# Patient Record
Sex: Male | Born: 1970 | Hispanic: No | Marital: Married | Smoking: Never smoker
Health system: Southern US, Community
[De-identification: ages and names within clinical notes are randomized; demographics above are authoritative.]

## PROBLEM LIST (undated history)

## (undated) DIAGNOSIS — K200 Eosinophilic esophagitis: Secondary | ICD-10-CM

## (undated) DIAGNOSIS — R6882 Decreased libido: Secondary | ICD-10-CM

## (undated) DIAGNOSIS — R351 Nocturia: Secondary | ICD-10-CM

## (undated) DIAGNOSIS — B977 Papillomavirus as the cause of diseases classified elsewhere: Secondary | ICD-10-CM

## (undated) DIAGNOSIS — G4700 Insomnia, unspecified: Secondary | ICD-10-CM

## (undated) DIAGNOSIS — E7800 Pure hypercholesterolemia, unspecified: Secondary | ICD-10-CM

## (undated) DIAGNOSIS — E039 Hypothyroidism, unspecified: Secondary | ICD-10-CM

## (undated) DIAGNOSIS — N529 Male erectile dysfunction, unspecified: Secondary | ICD-10-CM

## (undated) DIAGNOSIS — R7401 Elevation of levels of liver transaminase levels: Secondary | ICD-10-CM

## (undated) DIAGNOSIS — R740 Nonspecific elevation of levels of transaminase and lactic acid dehydrogenase [LDH]: Secondary | ICD-10-CM

## (undated) DIAGNOSIS — F909 Attention-deficit hyperactivity disorder, unspecified type: Secondary | ICD-10-CM

## (undated) DIAGNOSIS — M5136 Other intervertebral disc degeneration, lumbar region: Secondary | ICD-10-CM

## (undated) DIAGNOSIS — K219 Gastro-esophageal reflux disease without esophagitis: Secondary | ICD-10-CM

## (undated) DIAGNOSIS — E559 Vitamin D deficiency, unspecified: Secondary | ICD-10-CM

## (undated) DIAGNOSIS — IMO0002 Reserved for concepts with insufficient information to code with codable children: Secondary | ICD-10-CM

## (undated) HISTORY — DX: Reserved for concepts with insufficient information to code with codable children: IMO0002

## (undated) HISTORY — DX: Papillomavirus as the cause of diseases classified elsewhere: B97.7

## (undated) HISTORY — DX: Hypothyroidism, unspecified: E03.9

## (undated) HISTORY — DX: Nocturia: R35.1

## (undated) HISTORY — DX: Elevation of levels of liver transaminase levels: R74.01

## (undated) HISTORY — DX: Other intervertebral disc degeneration, lumbar region: M51.36

## (undated) HISTORY — DX: Eosinophilic esophagitis: K20.0

## (undated) HISTORY — DX: Insomnia, unspecified: G47.00

## (undated) HISTORY — DX: Nonspecific elevation of levels of transaminase and lactic acid dehydrogenase (ldh): R74.0

## (undated) HISTORY — DX: Decreased libido: R68.82

## (undated) HISTORY — DX: Gastro-esophageal reflux disease without esophagitis: K21.9

## (undated) HISTORY — PX: TONSILLECTOMY: SUR1361

## (undated) HISTORY — DX: Pure hypercholesterolemia, unspecified: E78.00

## (undated) HISTORY — DX: Attention-deficit hyperactivity disorder, unspecified type: F90.9

## (undated) HISTORY — DX: Vitamin D deficiency, unspecified: E55.9

## (undated) HISTORY — DX: Male erectile dysfunction, unspecified: N52.9

---
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
## 2007-09-09 ENCOUNTER — Ambulatory Visit (HOSPITAL_BASED_OUTPATIENT_CLINIC_OR_DEPARTMENT_OTHER): Admission: RE | Admit: 2007-09-09 | Discharge: 2007-09-09 | Payer: Self-pay | Admitting: Urology

## 2011-03-13 NOTE — Op Note (Signed)
NAMEJERMAIN, Scott Schmidt           ACCOUNT NO.:  [id number]   MEDICAL RECORD NO.:  [id number]          PATIENT TYPE:  AMB   LOCATION:  [facility]                         FACILITY:  [facility]   PHYSICIAN:  Jamison Neighbor, M.D.  DATE OF BIRTH:  1971-05-27   DATE OF PROCEDURE:  09/09/2007  DATE OF DISCHARGE:                               OPERATIVE REPORT   PREOPERATIVE DIAGNOSIS:  Meatal condyloma.   SECONDARY DIAGNOSIS:  Hematuria.   POSTOPERATIVE DIAGNOSIS:  Meatal condyloma and hematuria.   PROCEDURES:  1. Laser fulguration of meatal condyloma.  2. Examination under anesthesia for additional genital condyloma.  3. Cystoscopy.   SURGEON:  Jamison Neighbor, M.D.   ANESTHESIA:  General.   COMPLICATIONS:  None.   DRAINS:  A 14-French catheter.   HISTORY:  A 40 year old male who developed hematuria and was found to  have a cluster of condyloma at the meatus.  Careful inspection of the  penis in the office did not show any other condylomata.  The patient  does have a wife who is known to have HPV present.  The patient is now  to undergo laser fulguration along with cystoscopic examination.  He  understands the risks and benefits of the procedure and gave full and  informed consent.   PROCEDURE:  After successful induction of general anesthesia, the  patient was prepped and draped in usual sterile fashion in the dorsal  lithotomy position.  The meatus was carefully inspected and a nasal  speculum was  also used to aid in this area.  The patient had  condylomata exiting out of the urethra.  These were carefully fulgurated  with the laser on the low as possible settings, until all the tissue had  been fully vaporized.  The nasal speculum was used to confirm that there  was no additional tissue at the meatus.  The patient underwent acetic  acid testing.  The entire genital area was soaked in acetic acid and  careful inspection showed no acetowhite lesions, specifically nothing  along the  penile shaft or glans penis; nothing on the scrotum, nothing  in the perineal area and nothing in the rectal area.  Following  completion of the acetic acid test, a cystoscopic examination was  performed.  The cystoscope was inserted.  The urethra was visualized in  its entirety; it was found to be normal.  Beyond the verumontanum the  prostate was unremarkable with no obstruction.  The bladder was  carefully inspected, no tumors or stones could be seen.  Both ureteral  orifices were normal configuration and location.  Final inspection as  the scope was withdrawn also showed no sign of penile condylomata, and  it appeared that the area at the tip of the urethra had been fully  fulgurated.  A final inspection of the nasal speculum was again  confirmed that not only had the lesion been removed, but that adequate  hemostasis was obtained.   Because of its location, it was felt that a Foley catheter for a day or  two would be prudent; the catheter was inserted.  This will be placed to  straight drainage.  The patient will be given prescriptions for Septra  as well as Lorcet Plus.  He will return to remove catheter in 1-2 days.      Jamison Neighbor, M.D.  Electronically Signed     RJE/MEDQ  D:  09/09/2007  T:  09/09/2007  Job:  [id number]

## 2011-08-07 LAB — POCT HEMOGLOBIN-HEMACUE
Hemoglobin: 16.1
Operator id: 268271

## 2014-08-05 ENCOUNTER — Other Ambulatory Visit: Payer: Self-pay | Admitting: Internal Medicine

## 2014-08-05 DIAGNOSIS — R945 Abnormal results of liver function studies: Secondary | ICD-10-CM

## 2014-08-13 ENCOUNTER — Ambulatory Visit
Admission: RE | Admit: 2014-08-13 | Discharge: 2014-08-13 | Disposition: A | Discharge status: Home / Self Care | Payer: BC Managed Care – HMO | Source: Ambulatory Visit | Attending: Internal Medicine | Admitting: Internal Medicine

## 2014-08-13 DIAGNOSIS — R945 Abnormal results of liver function studies: Secondary | ICD-10-CM

## 2014-10-29 DIAGNOSIS — IMO0002 Reserved for concepts with insufficient information to code with codable children: Secondary | ICD-10-CM

## 2014-10-29 HISTORY — DX: Reserved for concepts with insufficient information to code with codable children: IMO0002

## 2014-10-29 HISTORY — PX: OTHER SURGICAL HISTORY: SHX169

## 2015-06-28 ENCOUNTER — Encounter (HOSPITAL_COMMUNITY): Payer: Self-pay | Admitting: Emergency Medicine

## 2015-06-28 ENCOUNTER — Emergency Department (HOSPITAL_COMMUNITY)
Admission: EM | Admit: 2015-06-28 | Discharge: 2015-06-28 | Disposition: A | Discharge status: Home / Self Care | Payer: BLUE CROSS/BLUE SHIELD | Attending: Emergency Medicine | Admitting: Emergency Medicine

## 2015-06-28 DIAGNOSIS — M5442 Lumbago with sciatica, left side: Secondary | ICD-10-CM

## 2015-06-28 DIAGNOSIS — M545 Low back pain: Secondary | ICD-10-CM | POA: Diagnosis present

## 2015-06-28 DIAGNOSIS — M62830 Muscle spasm of back: Secondary | ICD-10-CM | POA: Diagnosis not present

## 2015-06-28 DIAGNOSIS — Z880 Allergy status to penicillin: Secondary | ICD-10-CM | POA: Diagnosis not present

## 2015-06-28 MED ORDER — METHOCARBAMOL 500 MG PO TABS: 1000.0000 mg | ORAL_TABLET | Freq: Two times a day (BID) | ORAL | Status: DC

## 2015-06-28 MED ORDER — HYDROCODONE-ACETAMINOPHEN 5-325 MG PO TABS
1.0000 | ORAL_TABLET | Freq: Once | ORAL | Status: AC
  Administered 2015-06-28: 1 via ORAL
  Filled 2015-06-28: qty 1

## 2015-06-28 MED ORDER — PREDNISONE 20 MG PO TABS
60.0000 mg | ORAL_TABLET | Freq: Once | ORAL | Status: AC
  Administered 2015-06-28: 60 mg via ORAL
  Filled 2015-06-28: qty 3

## 2015-06-28 MED ORDER — METHOCARBAMOL 500 MG PO TABS
1000.0000 mg | ORAL_TABLET | Freq: Once | ORAL | Status: AC
  Administered 2015-06-28: 1000 mg via ORAL
  Filled 2015-06-28: qty 2

## 2015-06-28 MED ORDER — HYDROCODONE-ACETAMINOPHEN 5-325 MG PO TABS: 1.0000 | ORAL_TABLET | ORAL | Status: DC | PRN

## 2015-06-28 MED ORDER — PREDNISONE 10 MG PO TABS: ORAL_TABLET | ORAL | Status: DC

## 2015-06-28 NOTE — Discharge Instructions (Signed)
Back Exercises °Back exercises help treat and prevent back injuries. The goal of back exercises is to increase the strength of your abdominal and back muscles and the flexibility of your back. These exercises should be started when you no longer have back pain. Back exercises include: °· Pelvic Tilt. Lie on your back with your knees bent. Tilt your pelvis until the lower part of your back is against the floor. Hold this position 5 to 10 sec and repeat 5 to 10 times. °· Knee to Chest. Pull first 1 knee up against your chest and hold for 20 to 30 seconds, repeat this with the other knee, and then both knees. This may be done with the other leg straight or bent, whichever feels better. °· Sit-Ups or Curl-Ups. Bend your knees 90 degrees. Start with tilting your pelvis, and do a partial, slow sit-up, lifting your trunk only 30 to 45 degrees off the floor. Take at least 2 to 3 seconds for each sit-up. Do not do sit-ups with your knees out straight. If partial sit-ups are difficult, simply do the above but with only tightening your abdominal muscles and holding it as directed. °· Hip-Lift. Lie on your back with your knees flexed 90 degrees. Push down with your feet and shoulders as you raise your hips a couple inches off the floor; hold for 10 seconds, repeat 5 to 10 times. °· Back arches. Lie on your stomach, propping yourself up on bent elbows. Slowly press on your hands, causing an arch in your low back. Repeat 3 to 5 times. Any initial stiffness and discomfort should lessen with repetition over time. °· Shoulder-Lifts. Lie face down with arms beside your body. Keep hips and torso pressed to floor as you slowly lift your head and shoulders off the floor. °Do not overdo your exercises, especially in the beginning. Exercises may cause you some mild back discomfort which lasts for a few minutes; however, if the pain is more severe, or lasts for more than 15 minutes, do not continue exercises until you see your caregiver.  Improvement with exercise therapy for back problems is slow.  °See your caregivers for assistance with developing a proper back exercise program. °Document Released: 11/22/2004 Document Revised: 01/07/2012 Document Reviewed: 08/16/2011 °ExitCare® Patient Information ©2015 [employer]. This information is not intended to replace advice given to you by your health care provider. Make sure you discuss any questions you have with your health care provider. °Muscle Cramps and Spasms °Muscle cramps and spasms occur when a muscle or muscles tighten and you have no control over this tightening (involuntary muscle contraction). They are a common problem and can develop in any muscle. The most common place is in the calf muscles of the leg. Both muscle cramps and muscle spasms are involuntary muscle contractions, but they also have differences:  °· Muscle cramps are sporadic and painful. They may last a few seconds to a quarter of an hour. Muscle cramps are often more forceful and last longer than muscle spasms. °· Muscle spasms may or may not be painful. They may also last just a few seconds or much longer. °CAUSES  °It is uncommon for cramps or spasms to be due to a serious underlying problem. In many cases, the cause of cramps or spasms is unknown. Some common causes are:  °· Overexertion.   °· Overuse from repetitive motions (doing the same thing over and over).   °· Remaining in a certain position for a long period of time.   °· Improper   preparation, form, or technique while performing a sport or activity.   °· Dehydration.   °· Injury.   °· Side effects of some medicines.   °· Abnormally low levels of the salts and ions in your blood (electrolytes), especially potassium and calcium. This could happen if you are taking water pills (diuretics) or you are pregnant.   °Some underlying medical problems can make it more likely to develop cramps or spasms. These include, but are not limited to:  °· Diabetes.   °· Parkinson  disease.   °· Hormone disorders, such as thyroid problems.   °· Alcohol abuse.   °· Diseases specific to muscles, joints, and bones.   °· Blood vessel disease where not enough blood is getting to the muscles.   °HOME CARE INSTRUCTIONS  °· Stay well hydrated. Drink enough water and fluids to keep your urine clear or pale yellow. °· It may be helpful to massage, stretch, and relax the affected muscle. °· For tight or tense muscles, use a warm towel, heating pad, or hot shower water directed to the affected area. °· If you are sore or have pain after a cramp or spasm, applying ice to the affected area may relieve discomfort. °¨ Put ice in a plastic bag. °¨ Place a towel between your skin and the bag. °¨ Leave the ice on for 15-20 minutes, 03-04 times a day. °· Medicines used to treat a known cause of cramps or spasms may help reduce their frequency or severity. Only take over-the-counter or prescription medicines as directed by your caregiver. °SEEK MEDICAL CARE IF:  °Your cramps or spasms get more severe, more frequent, or do not improve over time.  °MAKE SURE YOU:  °· Understand these instructions. °· Will watch your condition. °· Will get help right away if you are not doing well or get worse. °Document Released: 04/06/2002 Document Revised: 02/09/2013 Document Reviewed: 10/01/2012 °ExitCare® Patient Information ©2015 [employer]. This information is not intended to replace advice given to you by your health care provider. Make sure you discuss any questions you have with your health care provider. ° °

## 2015-06-28 NOTE — ED Provider Notes (Signed)
CSN: [id number]     Arrival date & time 06/28/15  2242 History   First MD Initiated Contact with Patient 06/28/15 2251     Chief Complaint  Patient presents with  . Back Pain     (Consider location/radiation/quality/duration/timing/severity/associated sxs/prior Treatment) Patient is a 44 y.o. male presenting with back pain. The history is provided by the patient. No language interpreter was used.  Back Pain Location:  Lumbar spine Quality:  Stabbing and shooting Radiates to:  L thigh Pain severity:  Severe Onset quality:  Gradual Duration:  2 days Timing:  Constant Associated symptoms: no abdominal pain, no fever, no numbness and no weakness   Associated symptoms comment:  Patient with a history of "mild slipped discs L4-5, L5-S1 per previous MRI 2 years ago. He reports he is usually pain free, able to work out regularly. He has been moving over the last 2 days and has felt some mild back soreness. Today the soreness became sharp shooting pain that radiated into his left leg. No falls, weakness, numbness. No incontinence or saddle anesthesia.   No past medical history on file. Past Surgical History  Procedure Laterality Date  . Tonsillectomy     No family history on file. Social History  Substance Use Topics  . Smoking status: Not on file  . Smokeless tobacco: Not on file  . Alcohol Use: Not on file    Review of Systems  Constitutional: Negative for fever and chills.  Gastrointestinal: Negative.  Negative for nausea and abdominal pain.  Genitourinary: Negative.  Negative for enuresis.  Musculoskeletal: Positive for back pain. Negative for neck pain.  Skin: Negative.  Negative for color change.  Neurological: Negative.  Negative for weakness and numbness.      Allergies  Penicillins  Home Medications   Prior to Admission medications   Not on File   BP 125/54 mmHg  Pulse 67  Temp(Src) 98.3 F (36.8 C) (Oral)  Resp 16  SpO2 94% Physical Exam  Constitutional:  He is oriented to person, place, and time. He appears well-developed and well-nourished.  Neck: Normal range of motion.  Pulmonary/Chest: Effort normal.  Abdominal: Soft. He exhibits no mass. There is no tenderness.  Musculoskeletal: Normal range of motion. He exhibits no edema.  Minimal left paralumbar tenderness without swelling, discoloration. No sciatic tenderness on left. Distal pulses 2+.  Neurological: He is alert and oriented to person, place, and time. He has normal reflexes. No sensory deficit.  Skin: Skin is warm and dry.  Psychiatric: He has a normal mood and affect.    ED Course  Procedures (including critical care time) Labs Review Labs Reviewed - No data to display  Imaging Review No results found. I have personally reviewed and evaluated these images and lab results as part of my medical decision-making.   EKG Interpretation None      MDM   Final diagnoses:  None    1. Low back pain  Will start on prednisone taper dose, muscle relaxer and few Norco for prn pain. Refer to ortho. No neurologic deficits to raise concern for cauda equina or other neurologic emergency. Stable for discharge home.     Elpidio Anis, PA-C 06/29/15 4098  Pricilla Loveless, MD 07/02/15 [phone]

## 2015-06-28 NOTE — ED Notes (Signed)
Patient was alert, oriented and stable upon discharge. RN went over AVS and patient had no further questions.  

## 2015-06-28 NOTE — ED Notes (Signed)
Pt states that he has a hx of back problems and was moving today. States when he stopped his back 'seized' up and has been spasming since. Alert and oriented.

## 2015-10-30 DIAGNOSIS — E7800 Pure hypercholesterolemia, unspecified: Secondary | ICD-10-CM

## 2015-10-30 HISTORY — DX: Pure hypercholesterolemia, unspecified: E78.00

## 2017-09-26 ENCOUNTER — Encounter: Payer: Self-pay | Admitting: Internal Medicine

## 2017-10-10 ENCOUNTER — Other Ambulatory Visit: Payer: Self-pay | Admitting: Family Medicine

## 2017-10-10 DIAGNOSIS — R748 Abnormal levels of other serum enzymes: Secondary | ICD-10-CM

## 2017-10-16 ENCOUNTER — Ambulatory Visit
Admission: RE | Admit: 2017-10-16 | Discharge: 2017-10-16 | Disposition: A | Discharge status: Home / Self Care | Payer: 59 | Source: Ambulatory Visit | Attending: Family Medicine | Admitting: Family Medicine

## 2017-10-16 DIAGNOSIS — R748 Abnormal levels of other serum enzymes: Secondary | ICD-10-CM

## 2017-11-19 ENCOUNTER — Encounter: Payer: Self-pay | Admitting: Internal Medicine

## 2017-11-19 ENCOUNTER — Ambulatory Visit: Payer: BLUE CROSS/BLUE SHIELD | Admitting: Internal Medicine

## 2017-11-19 ENCOUNTER — Encounter (INDEPENDENT_AMBULATORY_CARE_PROVIDER_SITE_OTHER): Payer: Self-pay

## 2017-11-19 VITALS — BP 114/66 | HR 62 | Ht 72.5 in | Wt 201.0 lb

## 2017-11-19 DIAGNOSIS — R1310 Dysphagia, unspecified: Secondary | ICD-10-CM

## 2017-11-19 DIAGNOSIS — K219 Gastro-esophageal reflux disease without esophagitis: Secondary | ICD-10-CM

## 2017-11-19 DIAGNOSIS — R1319 Other dysphagia: Secondary | ICD-10-CM

## 2017-11-19 NOTE — Patient Instructions (Signed)

## 2017-11-19 NOTE — Progress Notes (Addendum)
Scott Schmidt 46 y.o. 1971/03/07 [id number] Referred by Dr. Mosetta PuttPeter Schmidt   Assessment & Plan:   Encounter Diagnoses  Name Primary?  . Esophageal dysphagia Yes  . Gastroesophageal reflux disease, esophagitis presence not specified    1. Dysphagia- likely d/t chronic acid reflux and scarring w/ stricture formation, could be d/t allergic esophagitis. EGD with possible esophageal dilation  recommended, pt was counseled on risks and benefits of procedure and he is agreeable.   2. GERD- chronic, counseled on benefits and risks of PPIs and informed pt of likelihood of restarting PPI after EGD. Pt felt more comfortable about the possibility of restarting PPIs.  I have personally seen the patient, reviewed and repeated key elements of the history and physical and participated in formation of the assessment and plan the student has documented.  Iva Booparl E. Gessner, MD, [facility]  I appreciate the opportunity to care for this patient. WU:[id number]:Schmidt, Scott AristaPeter, MD   Subjective:   Chief Complaint: dysphagia  HPI Pt is a 47 yo very nice man who presents today with cc of dysphagia. Pt has been dealing with acid reflux and trouble swallowing for 10-15 years. His symptoms are fairly constant with frequent episodes of dysphagia that occur with solid foods- meats and bread. He has not vomited d/t his dysphagia however has had several episodes of regurgitation. He also has heart burn which usually occurs after spicy meals.   Pt was on Nexium for about 3 years which helped his sxs but he stopped taking it 1.5 yrs ago after reading about side effects of dementia and his sxs have returned since. Approx 10 years ago, he had 1 episode of food being lodged in his esophagus which required endoscopic removal.  He has no other medical problems, not on any other meds, allergic to penicillin.   GI ROS otherwise negative  Allergies  Allergen Reactions  . Penicillins Hives   Current Meds  Medication Sig  .  baclofen (LIORESAL) 10 MG tablet Take 5 mg by mouth. 1-2 tablets every day at bedtime as needed for muscle spasm   . Cholecalciferol (VITAMIN D3) 2000 units capsule Take 2,000 Units by mouth daily.  Marland Kitchen. loratadine (CLARITIN) 10 MG tablet Take 10 mg by mouth daily as needed for allergies.  . Multiple Vitamin (MULTIVITAMIN) tablet Take 1 tablet by mouth daily.  . Omega-3 Fatty Acids (FISH OIL PO) Take 1,400 mg by mouth daily.  Marland Kitchen. PRESCRIPTION MEDICATION Testosterone gel 5%  Apply 1ml to upper chest or back every AM  Versa base  . Probiotic Product (ALIGN) 4 MG CAPS Take 1 capsule by mouth daily.  . TURMERIC PO Take by mouth. 1000/1500 mg daily   Past Medical History:  Diagnosis Date  . ADHD    since childhood  . DDD (degenerative disc disease), lumbar    intermittent right radicular symptoms since 2015  . Decreased libido   . ED (erectile dysfunction)    intermittently  . Elevated AST (SGOT)    mildly  since 2015  . GERD (gastroesophageal reflux disease)   . 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 Hypercholesterolemia 2017   mild  . Hypothyroidism    intermittently since 2014  . Insomnia   . Nocturia   . Squamous cell carcinoma 2016   right arm  . Vitamin D deficiency    Past Surgical History:  Procedure Laterality Date  . ANKLE FRACTURE SURGERY  1986  . cystourethroscopy for excision of HPV  2016  .  TONSILLECTOMY     Social History   Social History Narrative   Married,  3 children   Environmental education officer   Originally from [place]. 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    No EtOH, drugs, tobacco   family history includes Gout in his father; Hypertension in his father; Melanoma in his mother.   Review of Systems Positive for heartburn, dysphagia. Negative for all others.  Objective:   Physical Exam  @BP  114/66   Pulse 62   Ht 6' 0.5" (1.842 m)   Wt 201 lb (91.2 kg)   BMI 26.89 kg/m @  General:  Well-developed,  well-nourished and in no acute distress Eyes:  anicteric. ENT:   Mouth and posterior pharynx free of lesions.  Neck:   supple w/o thyromegaly or mass.  Lungs: Clear to auscultation bilaterally. Heart:  S1S2, no rubs, murmurs, gallops. Abdomen:  soft, non-tender, no hepatosplenomegaly, hernia, or mass and BS+.  Lymph:  no cervical or supraclavicular adenopathy. Extremities:   no edema, cyanosis or clubbing Skin   no rash. Neuro:  A&O x 3.  Psych:  appropriate mood and  Affect.   Data Reviewed: PCP notes, labs, meds.

## 2017-12-27 HISTORY — PX: ESOPHAGOGASTRODUODENOSCOPY: SHX1529

## 2018-01-09 ENCOUNTER — Encounter: Payer: Self-pay | Admitting: Internal Medicine

## 2018-01-10 ENCOUNTER — Encounter: Payer: 59 | Admitting: Internal Medicine

## 2018-01-16 ENCOUNTER — Encounter: Payer: 59 | Admitting: Internal Medicine

## 2018-01-23 ENCOUNTER — Other Ambulatory Visit: Payer: Self-pay

## 2018-01-23 ENCOUNTER — Ambulatory Visit (AMBULATORY_SURGERY_CENTER): Payer: 59 | Admitting: Internal Medicine

## 2018-01-23 ENCOUNTER — Encounter: Payer: Self-pay | Admitting: Internal Medicine

## 2018-01-23 VITALS — BP 130/76 | HR 64 | Temp 97.8°F | Resp 14 | Ht 72.5 in | Wt 201.0 lb

## 2018-01-23 DIAGNOSIS — K228 Other specified diseases of esophagus: Secondary | ICD-10-CM

## 2018-01-23 DIAGNOSIS — K317 Polyp of stomach and duodenum: Secondary | ICD-10-CM | POA: Diagnosis not present

## 2018-01-23 DIAGNOSIS — R1310 Dysphagia, unspecified: Secondary | ICD-10-CM

## 2018-01-23 DIAGNOSIS — K2950 Unspecified chronic gastritis without bleeding: Secondary | ICD-10-CM | POA: Diagnosis not present

## 2018-01-23 MED ORDER — SODIUM CHLORIDE 0.9 % IV SOLN: 500.0000 mL | Freq: Once | INTRAVENOUS | Status: DC

## 2018-01-23 NOTE — Op Note (Signed)
[facility] Patient Name: Scott Schmidt Procedure Date: 01/23/2018 11:06 AM MRN: [id number] Endoscopist: Iva Boop , MD Age: 47 Referring MD:  Date of Birth: 1971-09-05 Gender: Male Account #: [id number] Procedure:                Upper GI endoscopy Indications:              Dysphagia Medicines:                Propofol per Anesthesia, Monitored Anesthesia Care Procedure:                Pre-Anesthesia Assessment:                           - Prior to the procedure, a History and Physical                            was performed, and patient medications and                            allergies were reviewed. The patient's tolerance of                            previous anesthesia was also reviewed. The risks                            and benefits of the procedure and the sedation                            options and risks were discussed with the patient.                            All questions were answered, and informed consent                            was obtained. Prior Anticoagulants: The patient has                            taken no previous anticoagulant or antiplatelet                            agents. ASA Grade Assessment: II - A patient with                            mild systemic disease. After reviewing the risks                            and benefits, the patient was deemed in                            satisfactory condition to undergo the procedure.                           After obtaining informed consent, the endoscope was  passed under direct vision. Throughout the                            procedure, the patient's blood pressure, pulse, and                            oxygen saturations were monitored continuously. The                            Model GIF-[id number]) scope was introduced                            through the mouth, and advanced to the second part                            of duodenum. The  upper GI endoscopy was                            accomplished without difficulty. The patient                            tolerated the procedure well. Scope In: Scope Out: Findings:                 Mucosal changes including longitudinal furrows and                            white plaques were found in the entire esophagus.                            Biopsies were obtained from the proximal and distal                            esophagus with cold forceps for histology of                            suspected eosinophilic esophagitis. Verification of                            patient identification for the specimen was done.                            Estimated blood loss was minimal. The scope was                            withdrawn. Dilation was performed with a Maloney                            dilator with mild resistance at 54 Fr. The dilation                            site was examined following endoscope reinsertion  and showed moderate improvement in luminal                            narrowing. Estimated blood loss was minimal.                           Multiple small semi-sessile polyps with no stigmata                            of recent bleeding were found in the gastric body.                            Biopsies were taken with a cold forceps for                            histology. Verification of patient identification                            for the specimen was done. Estimated blood loss was                            minimal.                           Diffuse mild inflammation characterized by erythema                            and granularity was found in the gastric antrum.                            Biopsies were taken with a cold forceps for                            histology. Verification of patient identification                            for the specimen was done. Estimated blood loss was                            minimal.                            The exam was otherwise without abnormality.                           The cardia and gastric fundus were normal on                            retroflexion. Complications:            No immediate complications. Estimated Blood Loss:     Estimated blood loss was minimal. Impression:               - Esophageal mucosal changes suggestive of  eosinophilic esophagitis. Biopsied. Dilated.                           - Multiple gastric polyps. Biopsied.                           - Gastritis. Biopsied.                           - The examination was otherwise normal. Recommendation:           - Patient has a contact number available for                            emergencies. The signs and symptoms of potential                            delayed complications were discussed with the                            patient. Return to normal activities tomorrow.                            Written discharge instructions were provided to the                            patient.                           - Clear liquids x 1 hour then soft foods rest of                            day. Start prior diet tomorrow.                           - Continue present medications.                           - Await pathology results. Iva Booparl E Virginia Curl, MD 01/23/2018 11:35:31 AM This report has been signed electronically.

## 2018-01-23 NOTE — Patient Instructions (Addendum)
   It looks like you have a condition called eosinophilic esophagitis and possibly gastritis. Also some benign-looking stomach polyps that are probably not an issue - biopsies taken.  I dilated the esophagus also.  In eosinophilic esophagitis (e-o-sin-o-FILL-ik uh-sof-uh-JIE-tis), a type of white blood cell (eosinophil) builds up in the lining of the tube that connects your mouth to your stomach (esophagus). This buildup, which is a reaction to foods, allergens or acid reflux, can inflame or injure the esophageal tissue. Damaged esophageal tissue can lead to difficulty swallowing or cause food to get caught when you swallow.  Eosinophilic esophagitis is a chronic immune system disease. It has been identified only in the past two decades, but is now considered a major cause of digestive system (gastrointestinal) illness. Research is ongoing and will likely lead to revisions in its diagnosis and treatment.  I do think you will do better if you take an acid-blocking medication and will discuuss with you.  I appreciate the opportunity to care for you. Iva Booparl E. Gessner, MD, [facility]   **Handouts given on Esophagitis & Stricture and Post dilation diet.**  YOU HAD AN ENDOSCOPIC PROCEDURE TODAY: Refer to the procedure report and other information in the discharge instructions given to you for any specific questions about what was found during the examination. If this information does not answer your questions, please call [place] office at [phone] to clarify.   YOU SHOULD EXPECT: Some feelings of bloating in the abdomen. Passage of more gas than usual. Walking can help get rid of the air that was put into your GI tract during the procedure and reduce the bloating. If you had a lower endoscopy (such as a colonoscopy or flexible sigmoidoscopy) you may notice spotting of blood in your stool or on the toilet paper. Some abdominal soreness may be present for a day or two, also.  DIET: Your first meal  following the procedure should be a light meal and then it is ok to progress to your normal diet. A half-sandwich or bowl of soup is an example of a good first meal. Heavy or fried foods are harder to digest and may make you feel nauseous or bloated. Drink plenty of fluids but you should avoid alcoholic beverages for 24 hours. If you had a esophageal dilation, please see attached instructions for diet.    ACTIVITY: Your care partner should take you home directly after the procedure. You should plan to take it easy, moving slowly for the rest of the day. You can resume normal activity the day after the procedure however YOU SHOULD NOT DRIVE, use power tools, machinery or perform tasks that involve climbing or major physical exertion for 24 hours (because of the sedation medicines used during the test).   SYMPTOMS TO REPORT IMMEDIATELY: A gastroenterologist can be reached at any hour. Please call [phone]  for any of the following symptoms:   Following upper endoscopy (EGD, EUS, ERCP, esophageal dilation) Vomiting of blood or coffee ground material  New, significant abdominal pain  New, significant chest pain or pain under the shoulder blades  Painful or persistently difficult swallowing  New shortness of breath  Black, tarry-looking or red, bloody stools  FOLLOW UP:  If any biopsies were taken you will be contacted by phone or by letter within the next 1-3 weeks. Call [phone]  if you have not heard about the biopsies in 3 weeks.  Please also call with any specific questions about appointments or follow up tests.

## 2018-01-23 NOTE — Progress Notes (Signed)
Pt. Reports no change in his medical or surgical history since his pre-visit 11/19/2017.

## 2018-01-23 NOTE — Progress Notes (Signed)
Report to PACU, RN, vss, BBS= Clear.  

## 2018-01-23 NOTE — Progress Notes (Signed)
Called to room to assist during endoscopic procedure.  Patient ID and intended procedure confirmed with present staff. Received instructions for my participation in the procedure from the performing physician.  

## 2018-01-24 ENCOUNTER — Telehealth: Payer: Self-pay

## 2018-01-24 NOTE — Telephone Encounter (Signed)
This is not surprising after his dilation

## 2018-01-24 NOTE — Telephone Encounter (Signed)
Attempted to reach pt. With follow up call following endoscopic procedure 01/23/2018.  LM on pt. Voice mail.  Will try to reach pt. Again later today.

## 2018-01-24 NOTE — Telephone Encounter (Incomplete)
  Follow up Call-  Call back number 01/23/2018  Post procedure Call Back phone  # [phone]  Permission to leave phone message Yes  Some recent data might be hidden     Patient questions:  Do you have a fever, pain , or abdominal swelling? Yes.   Pain Score  4 *  Have you tolerated food without any problems? No.  Have you been able to return to your normal activities? Yes.    Do you have any questions about your discharge instructions: Diet   Yes.   Medications  No. Follow up visit  No.  Do you have questions or concerns about your Care? Yes.    Actions:  Pt. Reports some epigastric burning/discomfort following his procedure, more so than he had experienced prior to his procedure.     He is better today than he was yesterday.  Told pt. To avoid alcohol and spicy foods over the weekend, and to call if his symptoms have not continued to improve over the weekend. * If pain score is 4 or above: Physician/ provider Notified : Stan Headarl Gessner, MD.

## 2018-01-27 ENCOUNTER — Telehealth: Payer: Self-pay

## 2018-01-27 NOTE — Telephone Encounter (Signed)
Patient notified  He agrees to the Gaviscon.  He understands that we will call back with recommendations once the pathology report is back.

## 2018-01-27 NOTE — Telephone Encounter (Signed)
Sorry to hear that it did not help.  I am hopeful that pathology report will return soon so that I can guide treatment better.  Would see if Gaviscon makes a difference right now and we will contact him with next steps when pathology is in.  If he does not think this is acceptable for now we can try carafate 1 g qidac/hs 1 g tabs # 120 no refill  If he can hang in there I would prefer that I get pathology report info

## 2018-01-27 NOTE — Telephone Encounter (Signed)
Patient reports that he is still having esophgeal burning and pain with swallowing solid foods.  Absolutely no improvement following procedure 01/23/18.  "not preventing me from eating, but requires liquids to lessen the burning."  Please advise what the next step is

## 2018-01-28 ENCOUNTER — Encounter: Payer: Self-pay | Admitting: Internal Medicine

## 2018-01-28 DIAGNOSIS — K200 Eosinophilic esophagitis: Secondary | ICD-10-CM

## 2018-01-28 HISTORY — DX: Eosinophilic esophagitis: K20.0

## 2018-01-28 NOTE — Progress Notes (Signed)
Call from office - no letter or recall  1) Biopsies confirm eosinophilic esophagitis 2) Gastric biopsies not related - do show benign fundic gland polyps and mild chronic gastritis - both frequent findings and not causing problems  3) Treatment plans:   pantoprazole 40 mg qd before breakfast # 30 2 RF  [facility]10 ml take 1 mg (5ml) bid and do not drink or eat 30 mins after # 30 ml w/ 1 refill - should take at least 1 month (stop if ok at 1 month - continue another month if not) See me in 2 mos approx  Please let me know if he has other ? At this time - I plan to review more and discuss long-term Tx at f/u visit

## 2018-01-29 ENCOUNTER — Other Ambulatory Visit: Payer: Self-pay

## 2018-01-29 MED ORDER — AMBULATORY NON FORMULARY MEDICATION: 1 refills | Status: DC

## 2018-01-29 MED ORDER — PANTOPRAZOLE SODIUM 40 MG PO TBEC: 40.0000 mg | DELAYED_RELEASE_TABLET | Freq: Every day | ORAL | 2 refills | Status: DC

## 2018-02-04 ENCOUNTER — Telehealth: Payer: Self-pay | Admitting: Internal Medicine

## 2018-02-04 NOTE — Telephone Encounter (Signed)
His father is 5275 and these were the first polyps removed 2 one was precancerous.  Asking if he needs an earlier colonoscopy?

## 2018-02-04 NOTE — Telephone Encounter (Signed)
Patient notified

## 2018-02-04 NOTE — Telephone Encounter (Signed)
He does not need a colonoscopy earlier due to his father's history

## 2018-04-01 ENCOUNTER — Encounter: Payer: Self-pay | Admitting: Internal Medicine

## 2018-04-01 ENCOUNTER — Ambulatory Visit: Payer: 59 | Admitting: Internal Medicine

## 2018-04-01 VITALS — BP 110/76 | HR 64 | Ht 77.0 in | Wt 201.0 lb

## 2018-04-01 DIAGNOSIS — K648 Other hemorrhoids: Secondary | ICD-10-CM | POA: Diagnosis not present

## 2018-04-01 DIAGNOSIS — L309 Dermatitis, unspecified: Secondary | ICD-10-CM | POA: Diagnosis not present

## 2018-04-01 DIAGNOSIS — K644 Residual hemorrhoidal skin tags: Secondary | ICD-10-CM | POA: Insufficient documentation

## 2018-04-01 DIAGNOSIS — K200 Eosinophilic esophagitis: Secondary | ICD-10-CM

## 2018-04-01 MED ORDER — HYDROCORTISONE 2.5 % RE CREA: 1.0000 "application " | TOPICAL_CREAM | Freq: Two times a day (BID) | RECTAL | 1 refills | Status: AC | PRN

## 2018-04-01 MED ORDER — PANTOPRAZOLE SODIUM 20 MG PO TBEC: 20.0000 mg | DELAYED_RELEASE_TABLET | Freq: Every day | ORAL | 3 refills | Status: DC

## 2018-04-01 MED ORDER — NYSTATIN-TRIAMCINOLONE 100000-0.1 UNIT/GM-% EX OINT: 1.0000 "application " | TOPICAL_OINTMENT | Freq: Two times a day (BID) | CUTANEOUS | 1 refills | Status: DC

## 2018-04-01 NOTE — Assessment & Plan Note (Signed)
Doing well on daily PPI, status post esophageal dilation and 15 days of topical ingested budesonide.  Will reduce PPI to 20 mg pantoprazole daily and if this does not control his symptoms of dysphagia he will let me know.  Return in 1 to 2 years depending upon clinical course.  If doing well at 1 year can refill medication.

## 2018-04-01 NOTE — Progress Notes (Signed)
Scott ShiverJefferson Schmidt 47 y.o. [birth year] [id number]  Assessment & Plan:  Eosinophilic esophagitis Doing well on daily PPI, status post esophageal dilation and 15 days of topical ingested budesonide.  Will reduce PPI to 20 mg pantoprazole daily and if this does not control his symptoms of dysphagia he will let me know.  Return in 1 to 2 years depending upon clinical course.  If doing well at 1 year can refill medication.  Perianal dermatitis Treat with nystatin triamcinolone ointment.  Try to reduce fecal seepage if possible.  Treating hemorrhoids with hydrocortisone cream as needed after 2-week treatment.  Internal and external hemorrhoids  We will treat with hydrocortisone cream 2.5% twice daily for 2 weeks.  Then as needed.  I think these of the cause of some of his anorectal symptoms.  He does have spasm in that area I think banding would be tricky.  He is not tender, could consider nitroglycerin or diltiazem gel depending upon clinical course.  Subjective:   Chief Complaint: Follow-up of dysphagia with eosinophilic esophagitis and anal irritation  HPI The patient is here for follow-up, he was diagnosed with eosinophilic esophagitis at endoscopy in the spring, I dilated his esophagus with a 4354 French Maloney dilator and treated him with PPI and he took 15 days of ingested budesonide.  He is doing well and says he did not realize how bad he was until he felt well and is very happy with his current situation with respect to dysphagia.  He also wants help with fecal seepage and anal irritation.  He relates a history of multiple hemorrhoid treatments that sound like thermal coagulation therapy that were painful years ago in [place].  Now he is having problems where he has the white began about 10 or 15 minutes after defecation and is got a lot of itching and irritation in the anal area.  No bleeding no prolapse symptoms.  Allergies  Allergen Reactions  . Avelox  [Moxifloxacin Hcl In Nacl]  Rash  . Penicillins Hives  . Latex Rash   Current Meds  Medication Sig      . Cholecalciferol (VITAMIN D3) 2000 units capsule Vitamin D3  2000 IU by mouth once per day  . loratadine (CLARITIN) 10 MG tablet Take 10 mg by mouth daily as needed for allergies.  . Multiple Vitamin (MULTIVITAMIN) tablet Take 1 tablet by mouth daily.  . Omega-3 Fatty Acids (FISH OIL PO) Take 1,400 mg by mouth daily.  Marland Kitchen. PRESCRIPTION MEDICATION Testosterone gel 5%  Apply 1ml to upper chest or back every AM  Versa base  . Probiotic Product (ALIGN) 4 MG CAPS Take 1 capsule by mouth daily.  . TURMERIC PO Take by mouth. 1000/1500 mg daily  . [ pantoprazole (PROTONIX) 40 MG tablet Take 1 tablet (40 mg total) by mouth daily.   Past Medical History:  Diagnosis Date  . ADHD    since childhood  . DDD (degenerative disc disease), lumbar    intermittent right radicular symptoms since 2015  . Decreased libido   . ED (erectile dysfunction)    intermittently  . Elevated AST (SGOT)    mildly  since 2015  . Eosinophilic esophagitis 01/28/2018   Dx EGD 12/2017   . GERD (gastroesophageal reflux disease)   . 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 Hypercholesterolemia 2017   mild  . Hypothyroidism    intermittently since 2014  . Insomnia   . Nocturia   . Squamous cell carcinoma 2016  right arm  . Vitamin D deficiency    Past Surgical History:  Procedure Laterality Date  . ANKLE FRACTURE SURGERY  1986  . cystourethroscopy for excision of HPV  2016  . ESOPHAGOGASTRODUODENOSCOPY  12/2017  . TONSILLECTOMY     Social History   Social History Narrative   Married,  3 children   Environmental education officer   Originally from [place]. 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    No EtOH, drugs, tobacco   family history includes Gout in his father; Hypertension in his father; Melanoma in his mother.   Review of Systems  As above Objective:   Physical Exam BP 110/76   Pulse 64   Ht  6\' 5"  (1.956 m)   Wt 201 lb (91.2 kg)   BMI 23.84 kg/m  NAD  Rectal - mild perianal dermatitis Anal spasm, no mass and nontender  Ansocopy mildly nflamed external hems and Gr 1 int

## 2018-04-01 NOTE — Assessment & Plan Note (Signed)
We will treat with hydrocortisone cream 2.5% twice daily for 2 weeks.  Then as needed.  I think these of the cause of some of his anorectal symptoms.  He does have spasm in that area I think banding would be tricky.  He is not tender, could consider nitroglycerin or diltiazem gel depending upon clinical course.

## 2018-04-01 NOTE — Patient Instructions (Addendum)
We have sent the following medications to your pharmacy for you to pick up at your convenience: Mycolog, hydrocortisone cream and pantoprazole   I appreciate the opportunity to care for you. Stan Headarl Gessner, MD, [facility]

## 2018-04-01 NOTE — Assessment & Plan Note (Signed)
Treat with nystatin triamcinolone ointment.  Try to reduce fecal seepage if possible.  Treating hemorrhoids with hydrocortisone cream as needed after 2-week treatment.

## 2018-06-13 ENCOUNTER — Ambulatory Visit
Admission: RE | Admit: 2018-06-13 | Discharge: 2018-06-13 | Disposition: A | Discharge status: Home / Self Care | Payer: 59 | Source: Ambulatory Visit | Attending: Family Medicine | Admitting: Family Medicine

## 2018-06-13 ENCOUNTER — Other Ambulatory Visit: Payer: Self-pay | Admitting: Family Medicine

## 2018-06-13 DIAGNOSIS — M25461 Effusion, right knee: Secondary | ICD-10-CM

## 2018-06-13 DIAGNOSIS — M25561 Pain in right knee: Principal | ICD-10-CM

## 2019-01-07 ENCOUNTER — Other Ambulatory Visit: Payer: Self-pay

## 2019-01-07 ENCOUNTER — Other Ambulatory Visit: Payer: Self-pay | Admitting: Family Medicine

## 2019-01-07 ENCOUNTER — Ambulatory Visit
Admission: RE | Admit: 2019-01-07 | Discharge: 2019-01-07 | Disposition: A | Discharge status: Home / Self Care | Payer: 59 | Source: Ambulatory Visit | Attending: Family Medicine | Admitting: Family Medicine

## 2019-01-07 DIAGNOSIS — R05 Cough: Secondary | ICD-10-CM

## 2019-01-07 DIAGNOSIS — R059 Cough, unspecified: Secondary | ICD-10-CM

## 2021-02-27 ENCOUNTER — Other Ambulatory Visit: Payer: Self-pay | Admitting: Family Medicine

## 2021-02-28 LAB — SARS CORONAVIRUS 2 (TAT 6-24 HRS): SARS Coronavirus 2: NEGATIVE

## 2021-03-29 LAB — BASIC METABOLIC PANEL
BUN: 18 (ref 4–21)
CO2: 25 — AB (ref 13–22)
Chloride: 106 (ref 99–108)
Creatinine: 1.3 (ref 0.6–1.3)
Glucose: 111
Potassium: 4.5 (ref 3.4–5.3)
Sodium: 140 (ref 137–147)

## 2021-03-29 LAB — CBC: RBC: 5.39 — AB (ref 3.87–5.11)

## 2021-03-29 LAB — CBC AND DIFFERENTIAL
HCT: 50 (ref 41–53)
Hemoglobin: 16.9 (ref 13.5–17.5)
WBC: 6

## 2021-03-29 LAB — COMPREHENSIVE METABOLIC PANEL
Calcium: 9 (ref 8.7–10.7)
GFR calc Af Amer: 78
GFR calc non Af Amer: 67

## 2021-05-03 ENCOUNTER — Other Ambulatory Visit: Payer: Self-pay

## 2021-05-03 ENCOUNTER — Ambulatory Visit: Payer: Managed Care, Other (non HMO) | Admitting: Nurse Practitioner

## 2021-05-03 ENCOUNTER — Encounter: Payer: Self-pay | Admitting: Nurse Practitioner

## 2021-05-03 VITALS — BP 130/76 | HR 70 | Ht 77.0 in | Wt 196.0 lb

## 2021-05-03 DIAGNOSIS — R059 Cough, unspecified: Secondary | ICD-10-CM | POA: Diagnosis not present

## 2021-05-03 DIAGNOSIS — K200 Eosinophilic esophagitis: Secondary | ICD-10-CM

## 2021-05-03 DIAGNOSIS — R1310 Dysphagia, unspecified: Secondary | ICD-10-CM | POA: Diagnosis not present

## 2021-05-03 DIAGNOSIS — Z1211 Encounter for screening for malignant neoplasm of colon: Secondary | ICD-10-CM | POA: Diagnosis not present

## 2021-05-03 MED ORDER — PANTOPRAZOLE SODIUM 20 MG PO TBEC: 20.0000 mg | DELAYED_RELEASE_TABLET | Freq: Every day | ORAL | 3 refills | Status: DC

## 2021-05-03 NOTE — Patient Instructions (Addendum)
PROCEDURES: You have been scheduled for an EGD and Colonoscopy. Please follow the written instructions given to you at your visit today. If you use inhalers (even only as needed), please bring them with you on the day of your procedure.  Continue taking Pantoprazole 20 MG once a day. We sent in a refill.    It was great seeing you today! Thank you for entrusting me with your care and choosing [facility].  Arnaldo Natal, CRNP  The [place] [facility] providers would like to encourage you to use [facility] to communicate with providers for non-urgent requests or questions.  Due to long hold times on the telephone, sending your provider a message by [facility] may be faster and more efficient way to get a response. Please allow 48 business hours for a response.  Please remember that this is for non-urgent requests/questions.  If you are age 53 or older, your body mass index should be between 23-30. Your Body mass index is 23.24 kg/m. If this is out of the aforementioned range listed, please consider follow up with your Primary Care Provider.  If you are age 85 or younger, your body mass index should be between 19-25. Your Body mass index is 23.24 kg/m. If this is out of the aformentioned range listed, please consider follow up with your Primary Care Provider.

## 2021-05-03 NOTE — Progress Notes (Addendum)
05/03/2021 Scott Schmidt [id number] 02-Mar-1971   CHIEF COMPLAINT: Cough, schedule a screening colonoscopy  HISTORY OF PRESENT ILLNESS:  Scott Schmidt is a 50 year old male with a past medical history of ADHD, hypothyroidism, GERD and eosinophilic esophagitis.  He presents to our office today as recommended by his primary care physician Dr. Derinda Late for further evaluation regarding a persistent cough and to schedule a screening colonoscopy.  He reports having a dry cough February through April for a few years.  However, his seasonal cough did not recur 11/2020 until after he had COVID infection 01/2021 and has persisted since then.  He questions if his cough is possibly related to acid reflux or eosinophilic esophagitis.  He underwent an EGD 01/23/2018 by Dr. Carlean Purl which identified eosinophilic esophagitis and gastritis.  No evidence of Helicobacter pylori.  He was prescribed Pantoprazole 40 mg daily and Budesonide 1 mg p.o. twice daily.  He took the Budesonide for 1 month and continue taking Pantoprazole 40 mg for the next year and his heartburn symptoms mostly resolved.  He stopped taking pantoprazole approximately 18 months ago.  He has heartburn only if he eats [place] food.  He describes food such as bread, tuna and crackers will briefly get stuck in the upper esophagus and passed slowly after again several sips of water.  Infrequent NSAID use.  He is passing normal formed brown bowel movement daily.  He occasionally has anal irritation without rectal bleeding.  He underwent hemorrhoidectomy surgery 9 or 10 years ago.  No known family history of esophageal cysts, stomach or colon cancer. He underwent routine laboratory studies by his PCP which he reported were normal.  No other complaints at this time.     EGD 01/23/2018 by Dr. Carlean Purl:  - Esophageal mucosal changes suggestive of eosinophilic esophagitis. Biopsied. Dilated. - Multiple gastric polyps. Biopsied. - Gastritis.  Biopsied. - The examination was otherwise normal. 1. Surgical [P], gastric antrum - MILD CHRONIC GASTRITIS. - NEGATIVE FOR HELICOBACTER PYLORI. - NO INTESTINAL METAPLASIA, DYSPLASIA, OR MALIGNANCY. 2. Surgical [P], fundus and gastric body polyp BXs - FUNDIC GLAND POLYPS. - NO INTESTINAL METAPLASIA, DYSPLASIA, OR MALIGNANCY. 3. Surgical [P], esophagus - INCREASED INTRAEPITHELIAL EOSINOPHILS (>25 PER HPF). - NO INTESTINAL METAPLASIA, DYSPLASIA, OR MALIGNANCY.  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 Two maternal uncles with autoimmune disease. No family   Social History:  reports that he has never smoked. He has never used smokeless tobacco. He reports current alcohol use. He reports that he  does not use drugs.   Allergies  Allergen Reactions   Avelox  [Moxifloxacin Hcl In Nacl] Rash   Penicillins Hives   Latex Rash      Outpatient Encounter Medications as of 05/03/2021  Medication Sig   Cholecalciferol (VITAMIN D3) 2000 units capsule Vitamin D3  2000 IU by mouth once per day   hydrocortisone (ANUSOL-HC) 2.5 % rectal cream  Place 1 application rectally 2 (two) times daily as needed for hemorrhoids.   loratadine (CLARITIN) 10 MG tablet Take 10 mg by mouth daily as needed for allergies.   Multiple Vitamin (MULTIVITAMIN) tablet Take 1 tablet by mouth daily.   nystatin-triamcinolone ointment (MYCOLOG) Apply 1 application topically 2 (two) times daily. X 2 weeks then as needed for perianal irritation   Omega-3 Fatty Acids (FISH OIL PO) Take 1,400 mg by mouth daily.   pantoprazole (PROTONIX) 20 MG tablet Take 1 tablet (20 mg total) by mouth daily before breakfast.   PRESCRIPTION MEDICATION Testosterone gel 5%  Apply 6m to upper chest or back every AM  Versa base   Probiotic Product (ALIGN) 4 MG CAPS Take 1 capsule by mouth daily.   TURMERIC PO Take by mouth. 1000/1500 mg daily   No facility-administered encounter medications on file as of 05/03/2021.    REVIEW OF SYSTEMS:  Gen: Denies fever, sweats or chills. No weight loss.  CV: Denies chest pain, palpitations or edema. Resp: Denies cough, shortness of breath of hemoptysis.  GI: See HPI. GU : Denies urinary burning, blood in urine, increased urinary frequency or incontinence. MS: Denies joint pain, muscles aches or weakness. Derm: Denies rash, itchiness, skin lesions or unhealing ulcers. Psych: Denies depression, anxiety or memory loss. Heme: Denies bruising, bleeding. Neuro:  Denies headaches, dizziness or paresthesias. Endo:  Denies any problems with DM, thyroid or adrenal function.  PHYSICAL EXAM: BP 130/76   Pulse 70   Ht 6' 5"  (1.956 m)   Wt 196 lb (88.9 kg)   BMI 23.24 kg/m   General: 50year old male in no acute distress. Head: Normocephalic and atraumatic. Eyes:  Sclerae non-icteric, conjunctive pink. Ears: Normal auditory acuity. Mouth: Dentition intact. No ulcers or lesions.  Neck: Supple, no lymphadenopathy or thyromegaly.  Lungs: Clear bilaterally to auscultation without wheezes, crackles or rhonchi. Heart: Regular rate and rhythm. No  murmur, rub or gallop appreciated.  Abdomen: Soft, nontender, non distended. No masses. No hepatosplenomegaly. Normoactive bowel sounds x 4 quadrants.  Rectal: Deferred.  Musculoskeletal: Symmetrical with no gross deformities. Skin: Warm and dry. No rash or lesions on visible extremities. Extremities: No edema. Neurological: Alert oriented x 4, no focal deficits.  Psychological:  Alert and cooperative. Normal mood and affect.  ASSESSMENT AND PLAN:  [birth year]  50year old male with a history of eosinophilic esophagitis identified per EGD 12/2017 with a persistent cough possibly due to acid reflux. Infrequent dysphagia likely due to eosinophilic esophagitis. -EGD benefits and risks discussed including risk with sedation, risk of bleeding, perforation and infection  -Pantoprazole 20 mg daily for now  2.  Colon cancer screen -Colonoscopy benefits and risks discussed including risk with sedation, risk of bleeding, perforation and infection   Further recommendations to be determined after the above evaluation completed Request a copy of most recent laboratory studies from PCPs' office  Addendum: Laboratory studies 12/06/2020: WBC 6.0.  Hemoglobin 16.9.  Hematocrit 50.2.  Platelet 224.  Glucose 71.  BUN 21.  Creatinine 1.53.  Sodium 136.  Potassium 4.9.  Calcium 9.5.  Alk phos 50.  AST 46.  ALT 43.  Total bili 0.5. Laboratory studies 03/29/2021: BUN 18.  Creatinine 1.25.      CC:  Derinda Late, MD

## 2021-06-09 ENCOUNTER — Encounter: Payer: Managed Care, Other (non HMO) | Admitting: Internal Medicine

## 2021-07-05 ENCOUNTER — Encounter: Payer: Self-pay | Admitting: Internal Medicine

## 2021-07-05 ENCOUNTER — Other Ambulatory Visit: Payer: Self-pay

## 2021-07-05 ENCOUNTER — Ambulatory Visit (AMBULATORY_SURGERY_CENTER): Payer: Managed Care, Other (non HMO) | Admitting: Internal Medicine

## 2021-07-05 VITALS — BP 119/79 | HR 61 | Temp 97.8°F | Resp 15 | Ht 77.0 in | Wt 196.0 lb

## 2021-07-05 DIAGNOSIS — K200 Eosinophilic esophagitis: Secondary | ICD-10-CM

## 2021-07-05 DIAGNOSIS — Z1211 Encounter for screening for malignant neoplasm of colon: Secondary | ICD-10-CM

## 2021-07-05 MED ORDER — SODIUM CHLORIDE 0.9 % IV SOLN
500.0000 mL | INTRAVENOUS | Status: DC
Start: 2021-07-05 — End: 2021-07-05

## 2021-07-05 NOTE — Op Note (Signed)
[facility] Patient Name: Scott Schmidt Procedure Date: 07/05/2021 2:37 PM MRN: [id number] Endoscopist: Iva Boop , MD Age: 50 Referring MD:  Date of Birth: Jul 11, 1971 Gender: Male Account #: [id number] Procedure:                Upper GI endoscopy Indications:              Eosinophilic esophagitis, Follow-up of eosinophilic                            esophagitis Medicines:                Propofol per Anesthesia, Monitored Anesthesia Care Procedure:                Pre-Anesthesia Assessment:                           - Prior to the procedure, a History and Physical                            was performed, and patient medications and                            allergies were reviewed. The patient's tolerance of                            previous anesthesia was also reviewed. The risks                            and benefits of the procedure and the sedation                            options and risks were discussed with the patient.                            All questions were answered, and informed consent                            was obtained. Prior Anticoagulants: The patient has                            taken no previous anticoagulant or antiplatelet                            agents. ASA Grade Assessment: II - A patient with                            mild systemic disease. After reviewing the risks                            and benefits, the patient was deemed in                            satisfactory condition to undergo the procedure.  After obtaining informed consent, the endoscope was                            passed under direct vision. Throughout the                            procedure, the patient's blood pressure, pulse, and                            oxygen saturations were monitored continuously. The                            GIF [id number] #[id number] was introduced through the                            mouth, and advanced  to the second part of duodenum.                            The upper GI endoscopy was accomplished without                            difficulty. The patient tolerated the procedure                            well. Scope In: Scope Out: Findings:                 Mucosal changes including longitudinal furrows and                            punctate white spots were found in the upper third                            of the esophagus and in the middle third of the                            esophagus. Biopsies were obtained from the proximal                            and distal esophagus with cold forceps for                            histology of suspected eosinophilic esophagitis.                            Verification of patient identification for the                            specimen was done. Estimated blood loss was minimal.                           The entire examined stomach was normal.                           The  cardia and gastric fundus were normal on                            retroflexion.                           The examined duodenum was normal. Estimated blood                            loss was minimal. Complications:            No immediate complications. Estimated Blood Loss:     Estimated blood loss was minimal. Impression:               - Esophageal mucosal changes consistent with                            eosinophilic esophagitis. Biopsied.                           - Normal stomach.                           - Normal examined duodenum. Recommendation:           - Patient has a contact number available for                            emergencies. The signs and symptoms of potential                            delayed complications were discussed with the                            patient. Return to normal activities tomorrow.                            Written discharge instructions were provided to the                            patient.                            - Resume previous diet.                           - Continue present medications. pantoprazole 20 mg                            qd                           - Await pathology results. this will help decuide                            need for PPI, other treatment. He denies recent  dysphagia and the cough he has x months in first                            part of year x 3 yeasr is gone                           will also consider additional evaluation of                            abnormal transaminases                           - See the other procedure note for documentation of                            additional recommendations. Iva Boop, MD 07/05/2021 3:30:50 PM This report has been signed electronically.

## 2021-07-05 NOTE — Progress Notes (Signed)
Called to room to assist during endoscopic procedure.  Patient ID and intended procedure confirmed with present staff. Received instructions for my participation in the procedure from the performing physician.  

## 2021-07-05 NOTE — Patient Instructions (Addendum)
The esophagus looks better than last time but I do think there may be some persistent eosinophilic esophagitis changes. I took biopsies.  The colonoscopy was normal.  Once I see biopsy results will communicate them and recommendations to you and Dr. Duaine Dredge.  May want to consider reassessment of persistent abnormal transaminase levels also.  I appreciate the opportunity to care for you. Iva Boop, MD, [facility]  Continue present medications including Pantoprazole 20mg  daily.   Repeat colonoscopy in 10 years for screening purposes.   YOU HAD AN ENDOSCOPIC PROCEDURE TODAY AT THE [facility]:   Refer to the procedure report that was given to you for any specific questions about what was found during the examination.  If the procedure report does not answer your questions, please call your gastroenterologist to clarify.  If you requested that your care partner not be given the details of your procedure findings, then the procedure report has been included in a sealed envelope for you to review at your convenience later.  YOU SHOULD EXPECT: Some feelings of bloating in the abdomen. Passage of more gas than usual.  Walking can help get rid of the air that was put into your GI tract during the procedure and reduce the bloating. If you had a lower endoscopy (such as a colonoscopy or flexible sigmoidoscopy) you may notice spotting of blood in your stool or on the toilet paper. If you underwent a bowel prep for your procedure, you may not have a normal bowel movement for a few days.  Please Note:  You might notice some irritation and congestion in your nose or some drainage.  This is from the oxygen used during your procedure.  There is no need for concern and it should clear up in a day or so.  SYMPTOMS TO REPORT IMMEDIATELY:  Following lower endoscopy (colonoscopy or flexible sigmoidoscopy):  Excessive amounts of blood in the stool  Significant tenderness or worsening of abdominal  pains  Swelling of the abdomen that is new, acute  Fever of 100F or higher  Following upper endoscopy (EGD)  Vomiting of blood or coffee ground material  New chest pain or pain under the shoulder blades  Painful or persistently difficult swallowing  New shortness of breath  Fever of 100F or higher  Black, tarry-looking stools  For urgent or emergent issues, a gastroenterologist can be reached at any hour by calling [phone]. Do not use MyChart messaging for urgent concerns.    DIET:  We do recommend a small meal at first, but then you may proceed to your regular diet.  Drink plenty of fluids but you should avoid alcoholic beverages for 24 hours.  ACTIVITY:  You should plan to take it easy for the rest of today and you should NOT DRIVE or use heavy machinery until tomorrow (because of the sedation medicines used during the test).    FOLLOW UP: Our staff will call the number listed on your records 48-72 hours following your procedure to check on you and address any questions or concerns that you may have regarding the information given to you following your procedure. If we do not reach you, we will leave a message.  We will attempt to reach you two times.  During this call, we will ask if you have developed any symptoms of COVID 19. If you develop any symptoms (ie: fever, flu-like symptoms, shortness of breath, cough etc.) before then, please call [phone].  If you test positive for Covid 19 in  the 2 weeks post procedure, please call and report this information to [place].    If any biopsies were taken you will be contacted by phone or by letter within the next 1-3 weeks.  Please call us at [phone] if you have not heard about the biopsies in 3 weeks.    SIGNATURES/CONFIDENTIALITY: You and/or your care partner have signed paperwork which will be entered into your electronic medical record.  These signatures attest to the fact that that the information above on your After Visit  Summary has been reviewed and is understood.  Full responsibility of the confidentiality of this discharge information lies with you and/or your care-partner.

## 2021-07-05 NOTE — Progress Notes (Signed)
Pt's states no medical or surgical changes since previsit or office visit. 

## 2021-07-05 NOTE — Op Note (Signed)
[facility] Patient Name: Scott Schmidt Procedure Date: 07/05/2021 2:36 PM MRN: [id number] Endoscopist: Iva Boop , MD Age: 50 Referring MD:  Date of Birth: [birth year] Gender: Male Account #: [id number] Procedure:                Colonoscopy Indications:              Screening for colorectal malignant neoplasm, This                            is the patient's first colonoscopy Medicines:                Propofol per Anesthesia, Monitored Anesthesia Care Procedure:                Pre-Anesthesia Assessment:                           - Prior to the procedure, a History and Physical                            was performed, and patient medications and                            allergies were reviewed. The patient's tolerance of                            previous anesthesia was also reviewed. The risks                            and benefits of the procedure and the sedation                            options and risks were discussed with the patient.                            All questions were answered, and informed consent                            was obtained. Prior Anticoagulants: The patient has                            taken no previous anticoagulant or antiplatelet                            agents. ASA Grade Assessment: II - A patient with                            mild systemic disease. After reviewing the risks                            and benefits, the patient was deemed in                            satisfactory condition to undergo the procedure.  After obtaining informed consent, the colonoscope                            was passed under direct vision. Throughout the                            procedure, the patient's blood pressure, pulse, and                            oxygen saturations were monitored continuously. The                            CF [id number] #[id number] was introduced through the anus                             and advanced to the the cecum, identified by                            appendiceal orifice and ileocecal valve. The                            ileocecal valve, appendiceal orifice, and rectum                            were photographed. The quality of the bowel                            preparation was good. The colonoscopy was performed                            without difficulty. The patient tolerated the                            procedure well. The bowel preparation used was                            Miralax via split dose instruction. Scope In: 3:02:15 PM Scope Out: 3:15:21 PM Scope Withdrawal Time: 0 hours 10 minutes 18 seconds  Total Procedure Duration: 0 hours 13 minutes 6 seconds  Findings:                 The perianal and digital rectal examinations were                            normal. Pertinent negatives include normal prostate                            (size, shape, and consistency).                           The colon (entire examined portion) appeared normal.                           No additional abnormalities were found on  retroflexion. Complications:            No immediate complications. Estimated blood loss:                            None. Estimated Blood Loss:     Estimated blood loss: none. Recommendation:           - Repeat colonoscopy in 10 years for screening                            purposes.                           - Patient has a contact number available for                            emergencies. The signs and symptoms of potential                            delayed complications were discussed with the                            patient. Return to normal activities tomorrow.                            Written discharge instructions were provided to the                            patient.                           - Resume previous diet.                           - Continue present medications.                            - See the other procedure note for documentation of                            additional recommendations. Iva Boop, MD 07/05/2021 3:32:51 PM This report has been signed electronically.

## 2021-07-05 NOTE — Progress Notes (Signed)
I discussed abnl transaminases in recovery. Also has mildly elevated creatinine.  He is taking a "low dose" testosterone supplement. I suppose that may be implicated.  He also exercises vigorously and that can cause abnormal transaminases (he lifts weights and does HIT).  Will discuss more with biopsy review.

## 2021-07-05 NOTE — Progress Notes (Signed)
Report to PACU, RN, vss, BBS= Clear.  

## 2021-07-05 NOTE — Progress Notes (Signed)
Gastroenterology History and Physical   Primary Care Physician:  Mosetta Putt, MD   Reason for Procedure:   Eosinophilic esophagitis, dysphagia, cough and colon cancer screenuing  Plan:    Colonoscopy and EGD/dilate esophagus     HPI: Scott Schmidt is a 50 y.o. male with the above complaints here for upper endoscopy and colonoscopy. Cough has resolved and no recent dysphagia.  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 Authorizing Provider  ALPRAZolam Prudy Feeler) 0.5 MG tablet Take 0.25-0.5 mg by mouth 2 (two) times daily as needed. 04/04/21  Yes [provider]  Cholecalciferol (VITAMIN D3) 2000 units capsule Vitamin D3  2000 IU by mouth once per day   Yes [provider]  clomiPHENE (CLOMID) 50 MG tablet Take 50 mg by mouth 3 (three) times a week. 04/21/21  Yes [provider]  Multiple Vitamin (MULTIVITAMIN) tablet Take 1 tablet by mouth daily.   Yes [provider]  pantoprazole (PROTONIX) 20 MG tablet Take 1 tablet (20 mg total) by mouth daily before breakfast. 05/03/21  Yes Arnaldo Natal, NP  Probiotic Product (ALIGN) 4 MG  CAPS Take 1 capsule by mouth daily.   Yes [provider]  TURMERIC PO Take by mouth. 1000/1500 mg daily   Yes [provider]  hydrocortisone (ANUSOL-HC) 2.5 % rectal cream Place 1 application rectally 2 (two) times daily as needed for hemorrhoids. 04/01/18   Iva Boop, MD  levothyroxine (SYNTHROID) 100 MCG tablet Take 100 mcg by mouth every morning. 03/10/21   [provider]  Molnupiravir 200 MG CAPS SMARTSIG:4 Capsule(s) By Mouth Every 12 Hours Patient not taking: Reported on 07/05/2021 03/21/21   [provider]  Omega-3 Fatty Acids (FISH OIL PO) Take 1,400 mg by mouth daily. Patient not taking: Reported on 07/05/2021    [provider]  SYMBICORT 160-4.5 MCG/ACT inhaler Inhale 2 puffs into the lungs 2 (two) times daily. Patient not taking: Reported on 07/05/2021 04/26/21   [provider]  tadalafil (CIALIS) 5 MG tablet Take 5 mg by mouth daily. 03/14/21   [provider]    Current Outpatient Medications  Medication Sig Dispense Refill   ALPRAZolam (XANAX) 0.5 MG tablet Take 0.25-0.5 mg by mouth 2 (two) times daily as needed.     Cholecalciferol (VITAMIN D3) 2000 units capsule Vitamin D3  2000 IU by mouth once per day     clomiPHENE (CLOMID) 50 MG tablet Take 50 mg by mouth 3 (three) times a week.  Multiple Vitamin (MULTIVITAMIN) tablet Take 1 tablet by mouth daily.     pantoprazole (PROTONIX) 20 MG tablet Take 1 tablet (20 mg total) by mouth daily before breakfast. 90 tablet 3   Probiotic Product (ALIGN) 4 MG CAPS Take 1 capsule by mouth daily.     TURMERIC PO Take by mouth. 1000/1500 mg daily     hydrocortisone (ANUSOL-HC) 2.5 % rectal cream Place 1 application rectally 2 (two) times daily as needed for hemorrhoids. 30 g 1   levothyroxine (SYNTHROID) 100 MCG tablet Take 100 mcg by mouth every morning.     Molnupiravir 200 MG CAPS SMARTSIG:4 Capsule(s) By Mouth Every 12 Hours (Patient not taking: Reported on 07/05/2021)      Omega-3 Fatty Acids (FISH OIL PO) Take 1,400 mg by mouth daily. (Patient not taking: Reported on 07/05/2021)     SYMBICORT 160-4.5 MCG/ACT inhaler Inhale 2 puffs into the lungs 2 (two) times daily. (Patient not taking: Reported on 07/05/2021)     tadalafil (CIALIS) 5 MG tablet Take 5 mg by mouth daily.     Current Facility-Administered Medications  Medication Dose Route Frequency Provider Last Rate Last Admin   0.9 %  sodium chloride infusion  500 mL Intravenous Continuous Iva Boop, MD        Allergies as of 07/05/2021 - Review Complete 07/05/2021  Allergen Reaction Noted   Avelox  [moxifloxacin hcl in nacl] Rash 12/14/2013   Avelox [moxifloxacin] Rash 12/14/2013   Penicillins Hives 06/28/2015   Latex Rash 01/21/2015    Family History  Problem Relation Age of Onset   Melanoma Mother        malignant   Hypertension Father    Gout Father    Colon polyps Neg Hx    Colon cancer Neg Hx    Esophageal cancer Neg Hx    Rectal cancer Neg Hx    Stomach cancer Neg Hx     Social History   Socioeconomic History   Marital status: Married    Spouse name: Not on file   Number of children: 3   Years of education: Not on file   Highest education level: Not on file  Occupational History   Occupation: Transport planner    Comment: Paediatric nurse  Tobacco Use   Smoking status: Never   Smokeless tobacco: Never  Substance and Sexual Activity   Alcohol use: Yes    Comment: occ   Drug use: No   Sexual activity: Yes  Other Topics Concern   Not on file  Social History Narrative   Married,  3 children   Environmental education officer   Originally from [place]. 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    No EtOH, drugs, tobacco   Social Determinants of Corporate investment banker Strain: Not on file  Food Insecurity: Not on file  Transportation Needs: Not on file  Physical Activity: Not on file  Stress: Not on file  Social  Connections: Not on file  Intimate Partner Violence: Not on file    Review of Systems: Otherwise negative  Physical Exam: Vital signs BP 122/69   Pulse 66   Temp 97.8 F (36.6 C) (Temporal)   Resp 10   Ht 6\' 5"  (1.956 m)   Wt 196 lb (88.9 kg)   SpO2 100%   BMI 23.24 kg/m   General:   Alert,  Well-developed, well-nourished, pleasant and cooperative in NAD Lungs:  Clear throughout to auscultation.  Heart:  Regular rate and rhythm; no murmurs, clicks, rubs,  or gallops. Abdomen:  Soft, nontender and nondistended. Normal bowel sounds.   Neuro/Psych:  Alert and cooperative. Normal mood and affect. A and O x 3   @Belva Koziel  , MD, [facility] [phone] (pager) 07/05/2021 2:44 PM@

## 2021-08-07 ENCOUNTER — Other Ambulatory Visit (INDEPENDENT_AMBULATORY_CARE_PROVIDER_SITE_OTHER): Payer: Managed Care, Other (non HMO)

## 2021-08-07 ENCOUNTER — Other Ambulatory Visit: Payer: Self-pay | Admitting: Internal Medicine

## 2021-08-07 DIAGNOSIS — R748 Abnormal levels of other serum enzymes: Secondary | ICD-10-CM | POA: Diagnosis not present

## 2021-08-07 LAB — HEPATIC FUNCTION PANEL
ALT: 35 U/L (ref 0–53)
AST: 36 U/L (ref 0–37)
Albumin: 4.5 g/dL (ref 3.5–5.2)
Alkaline Phosphatase: 41 U/L (ref 39–117)
Bilirubin, Direct: 0.1 mg/dL (ref 0.0–0.3)
Total Bilirubin: 0.2 mg/dL (ref 0.2–1.2)
Total Protein: 7.3 g/dL (ref 6.0–8.3)

## 2021-10-12 ENCOUNTER — Ambulatory Visit
Admission: RE | Admit: 2021-10-12 | Discharge: 2021-10-12 | Disposition: A | Discharge status: Home / Self Care | Payer: Managed Care, Other (non HMO) | Source: Ambulatory Visit | Attending: Family Medicine | Admitting: Family Medicine

## 2021-10-12 ENCOUNTER — Other Ambulatory Visit: Payer: Self-pay | Admitting: Family Medicine

## 2021-10-12 ENCOUNTER — Other Ambulatory Visit: Payer: Self-pay

## 2021-10-12 DIAGNOSIS — R52 Pain, unspecified: Secondary | ICD-10-CM

## 2021-10-27 ENCOUNTER — Encounter: Payer: Self-pay | Admitting: Gastroenterology

## 2021-10-27 NOTE — Telephone Encounter (Signed)
Note opened in error. Closed out.

## 2022-01-09 ENCOUNTER — Other Ambulatory Visit: Payer: Self-pay | Admitting: Family Medicine

## 2022-01-09 ENCOUNTER — Ambulatory Visit
Admission: RE | Admit: 2022-01-09 | Discharge: 2022-01-09 | Disposition: A | Discharge status: Home / Self Care | Payer: Managed Care, Other (non HMO) | Source: Ambulatory Visit | Attending: Family Medicine | Admitting: Family Medicine

## 2022-01-09 ENCOUNTER — Other Ambulatory Visit: Payer: Self-pay

## 2022-01-09 DIAGNOSIS — R053 Chronic cough: Secondary | ICD-10-CM

## 2022-01-09 DIAGNOSIS — R0989 Other specified symptoms and signs involving the circulatory and respiratory systems: Secondary | ICD-10-CM

## 2022-05-03 ENCOUNTER — Other Ambulatory Visit: Payer: Self-pay | Admitting: Nurse Practitioner

## 2022-10-21 IMAGING — CR DG ANKLE COMPLETE 3+V*R*
3 series · 3 of 3 positions shown · non-contrast
Comparison: None.

CLINICAL DATA: Pain

EXAM:
RIGHT FOOT COMPLETE - 3+ VIEW; RIGHT ANKLE - COMPLETE 3+ VIEW

[x ankle ap right]
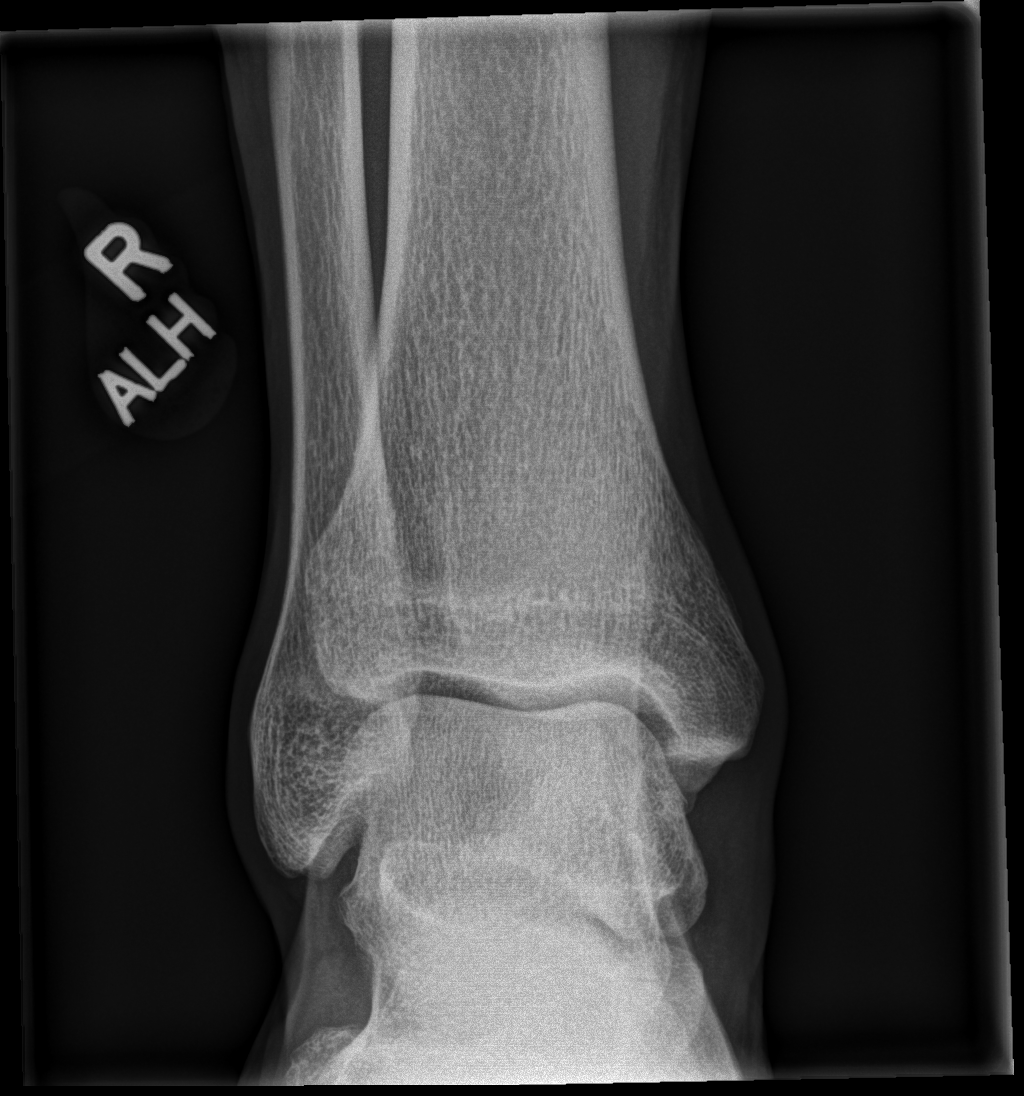

[x ankle obl right]
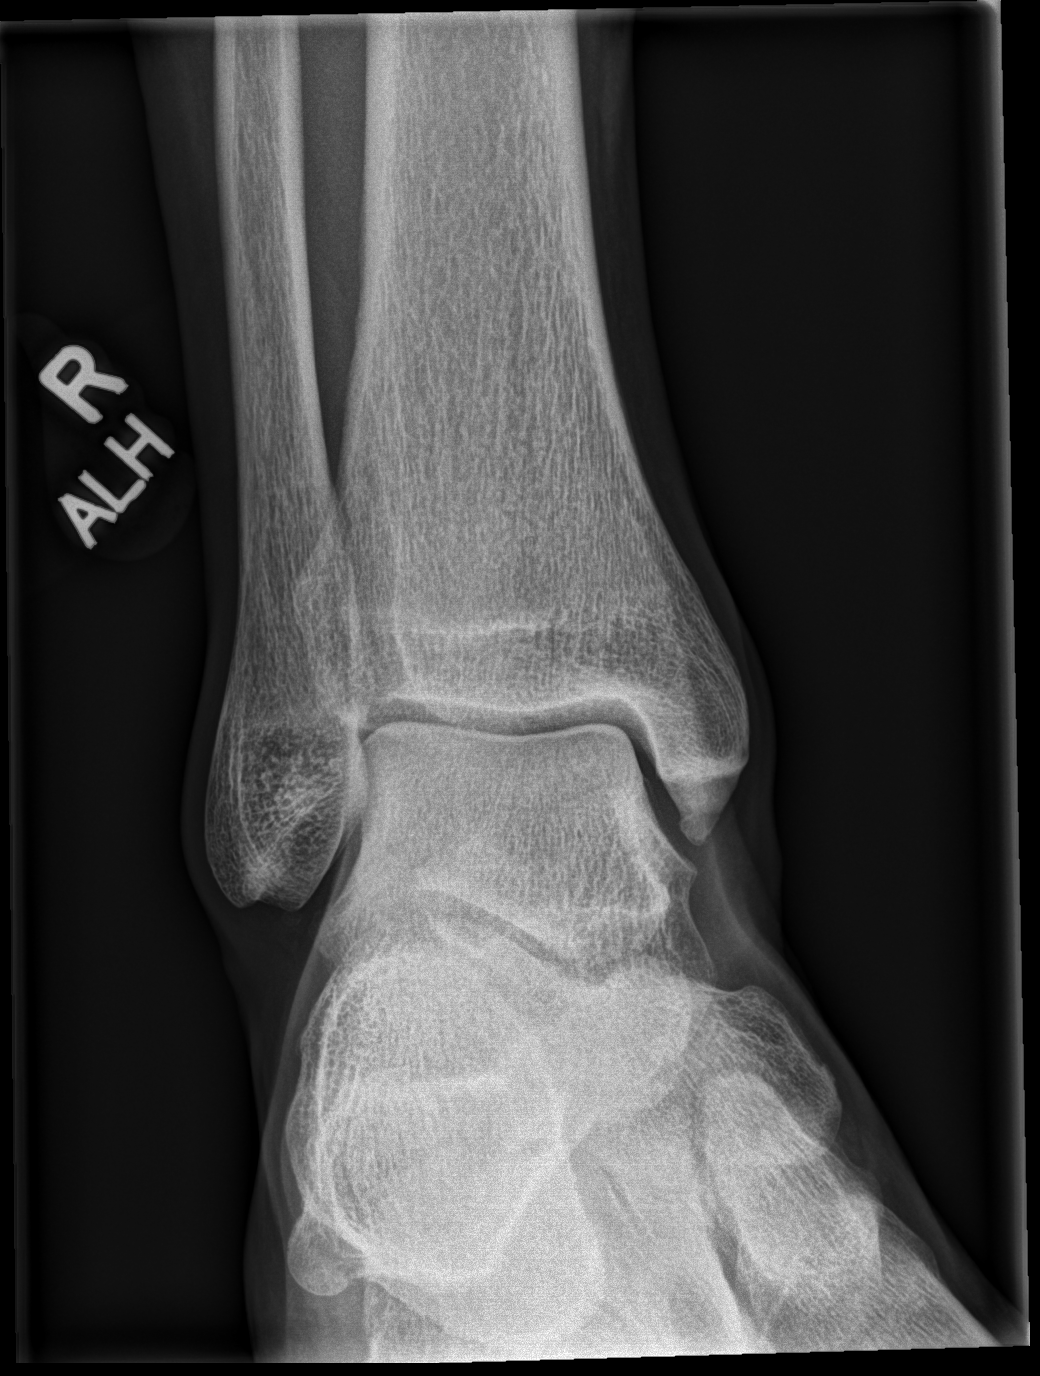

[x ankle lat right]
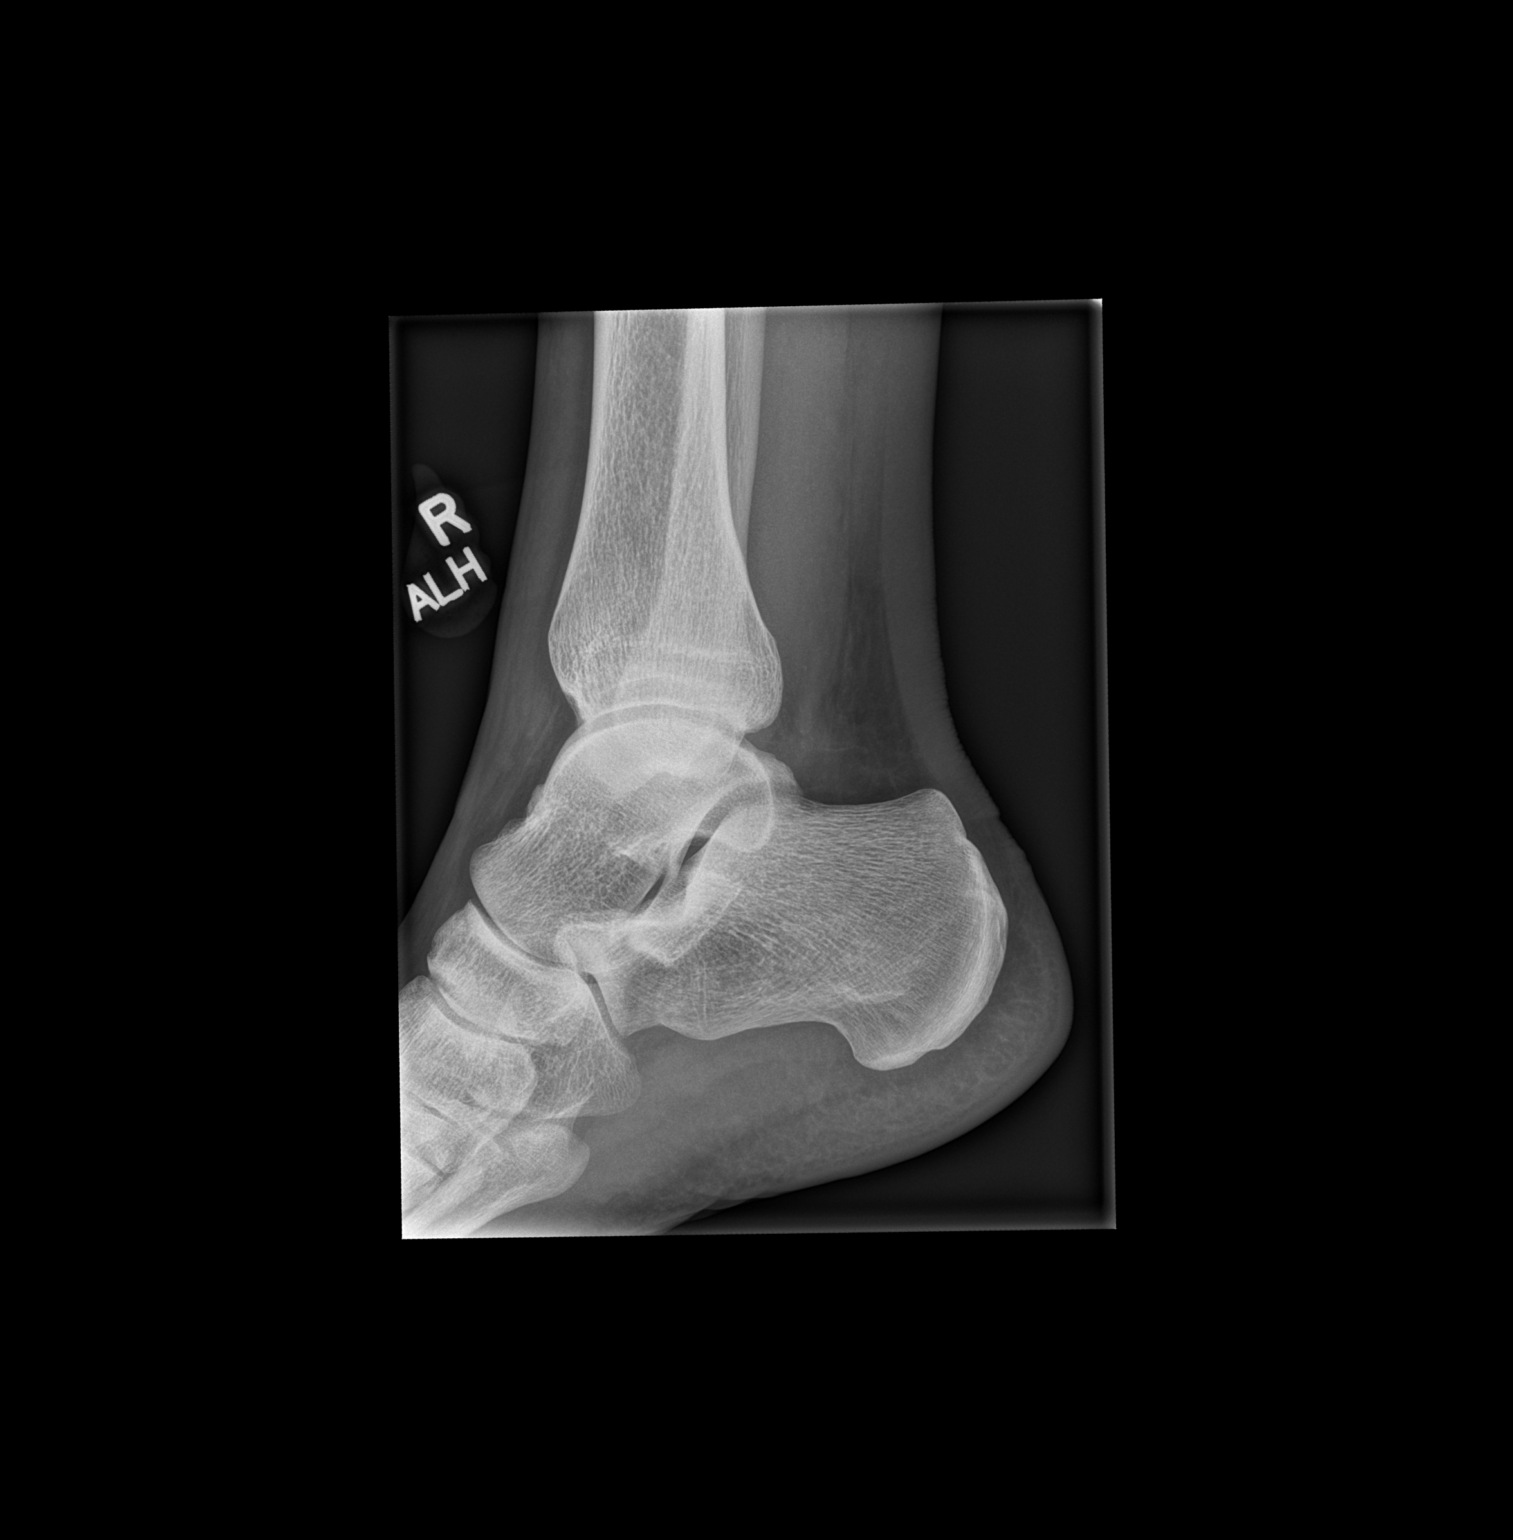

[3 of 3 positions shown; findings below may reference images not displayed]

FINDINGS: There is no evidence of fracture or dislocation. There is no
evidence of arthropathy or other focal bone abnormality. Soft
tissues are unremarkable.
IMPRESSION: No acute osseous abnormality of the right ankle or foot.

## 2022-12-21 ENCOUNTER — Other Ambulatory Visit: Payer: Self-pay | Admitting: Nurse Practitioner

## 2022-12-21 MED ORDER — PANTOPRAZOLE SODIUM 20 MG PO TBEC: DELAYED_RELEASE_TABLET | ORAL | 0 refills | Status: AC

## 2022-12-21 NOTE — Telephone Encounter (Signed)
Contacted pt to schedule a follow up appointment and patient stated that he moved to [facility] is trying to find a Copywriter, advertising in the area.

## 2022-12-21 NOTE — Telephone Encounter (Signed)
Please advise 

## 2023-01-18 IMAGING — CR DG CHEST 2V
2 series · 2 of 2 positions shown · non-contrast
Comparison: 01/07/2019

CLINICAL DATA: Persistent cough, chest congestion, nonsmoker

EXAM:
CHEST - 2 VIEW

[w chest pa]
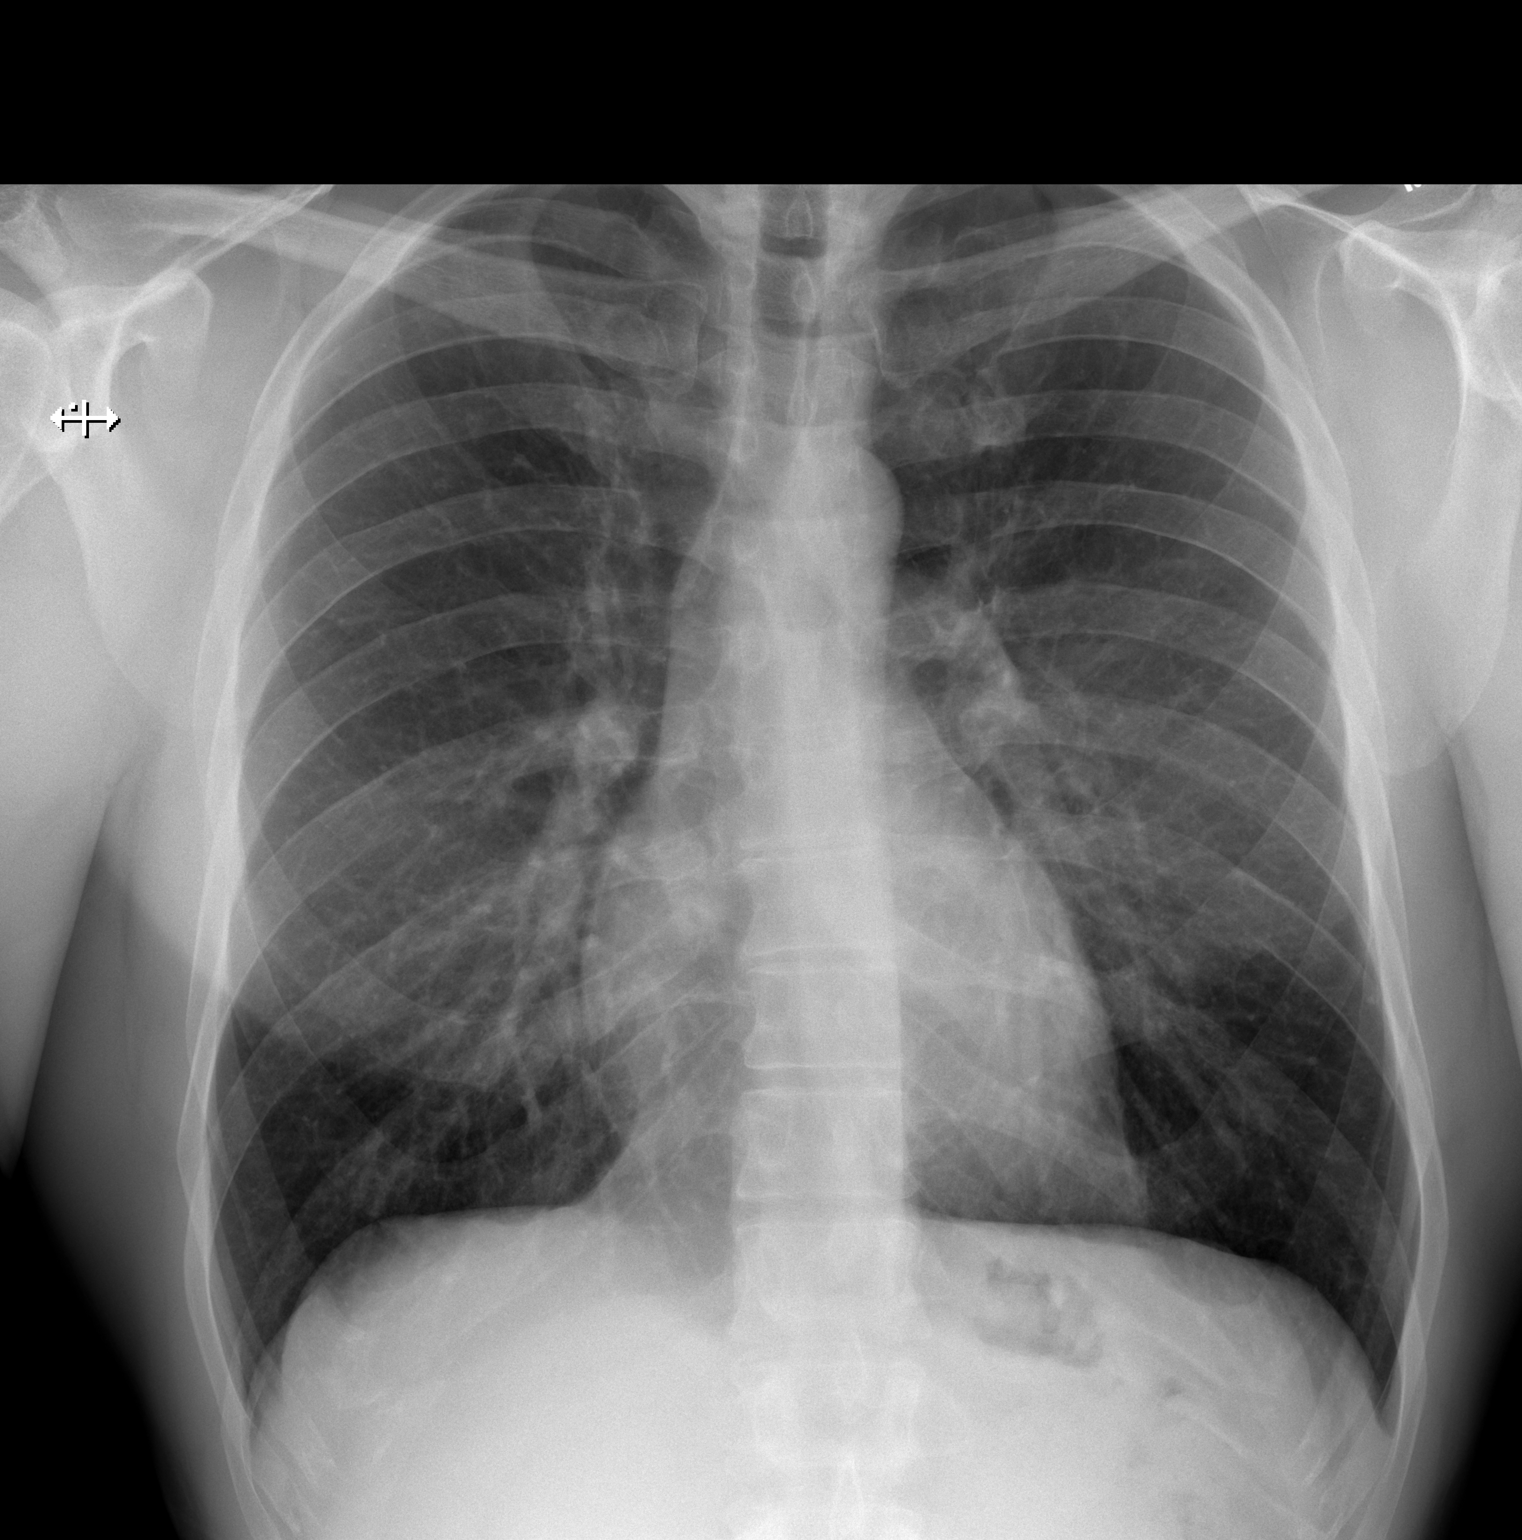

[w chest lat]
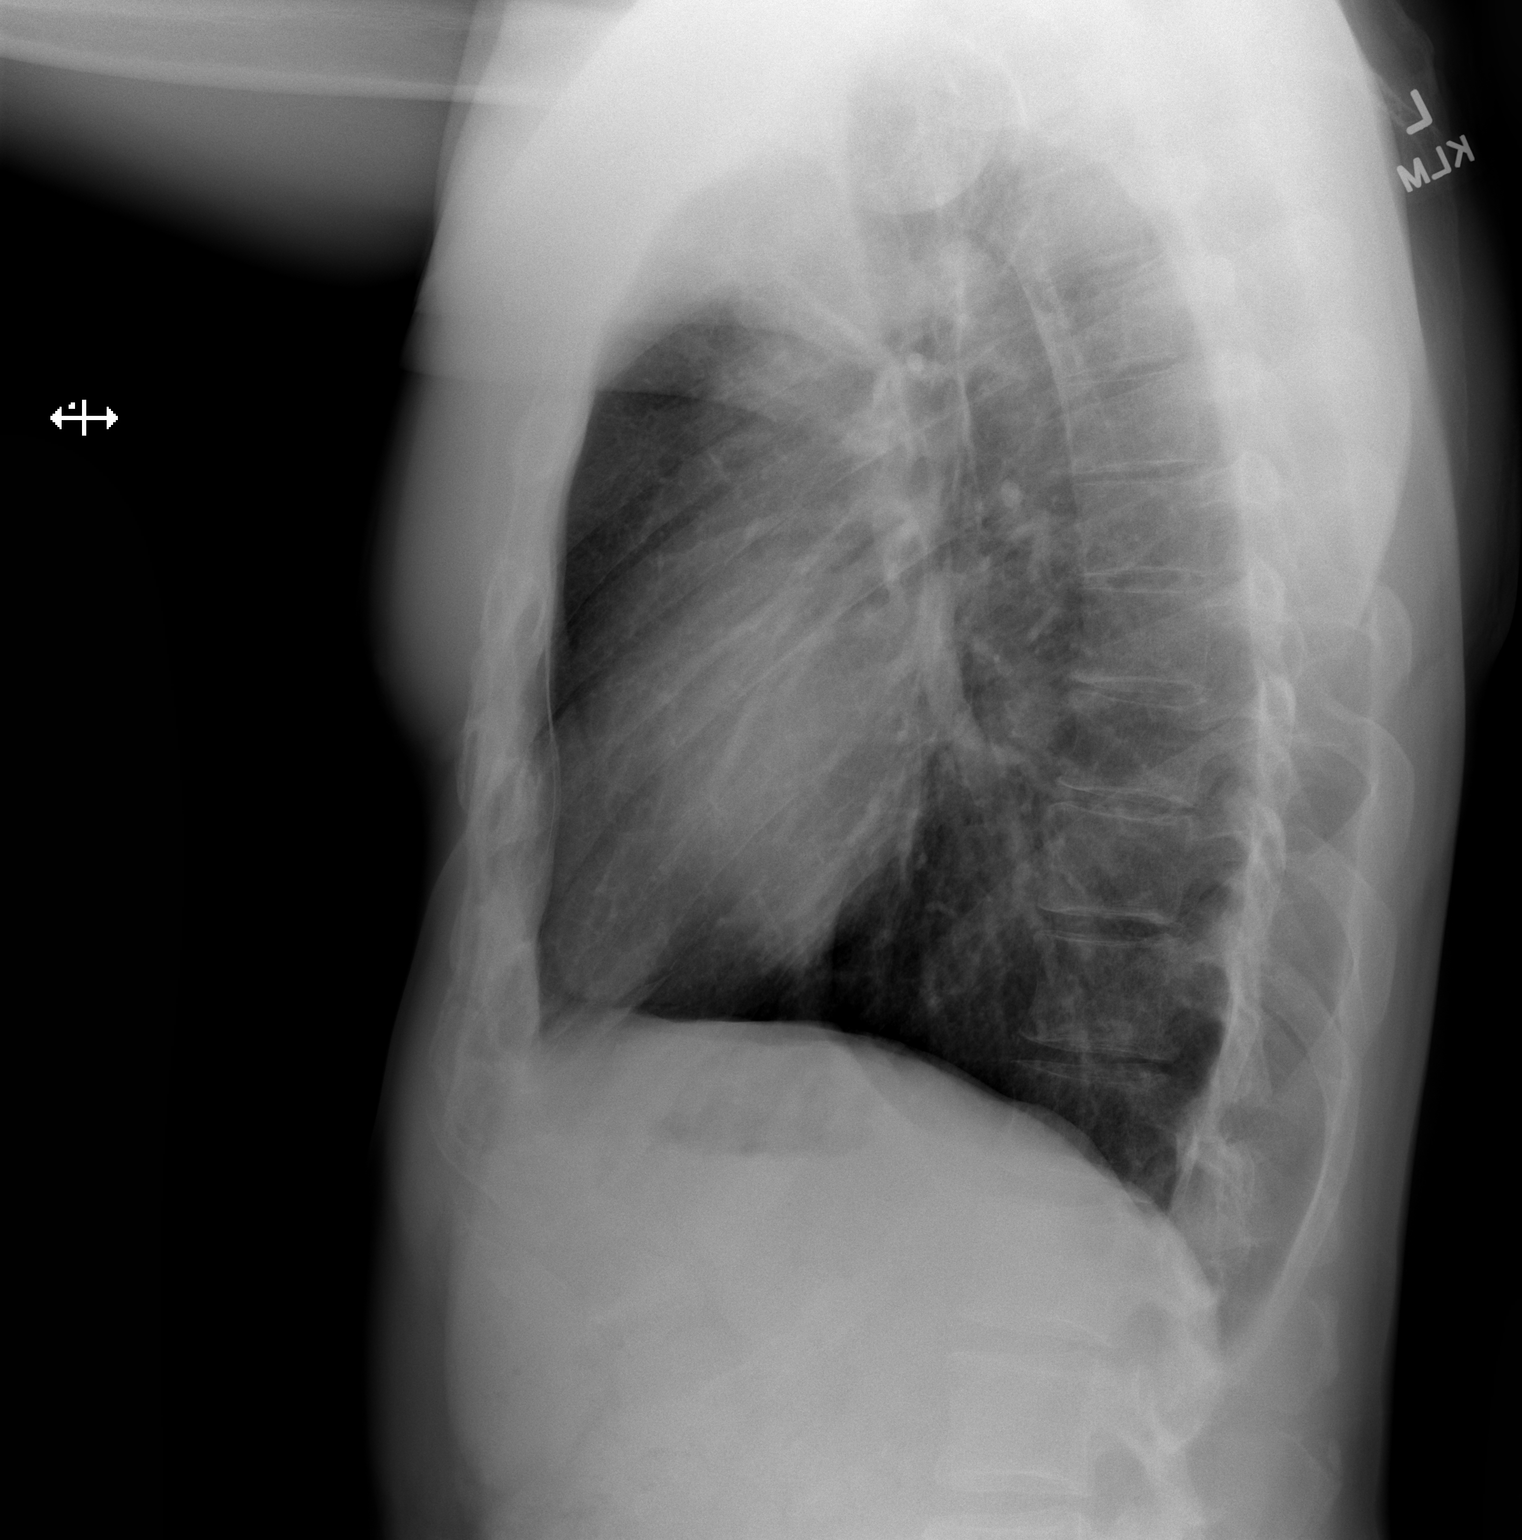

[2 of 2 positions shown; findings below may reference images not displayed]

FINDINGS: The heart size and mediastinal contours are within normal limits.
Both lungs are clear. The visualized skeletal structures are
unremarkable.
IMPRESSION: No acute abnormality of the lungs.

## 2023-03-27 ENCOUNTER — Other Ambulatory Visit: Payer: Self-pay | Admitting: Nurse Practitioner

## 2023-07-24 ENCOUNTER — Other Ambulatory Visit: Payer: Self-pay | Admitting: Nurse Practitioner
